# Patient Record
Sex: Male | Born: 1955 | Race: White | Hispanic: No | Marital: Married | Smoking: Former smoker
Health system: Southern US, Community
[De-identification: ages and names within clinical notes are randomized; demographics above are authoritative.]

## PROBLEM LIST (undated history)

## (undated) DIAGNOSIS — I10 Essential (primary) hypertension: Secondary | ICD-10-CM

## (undated) DIAGNOSIS — J45909 Unspecified asthma, uncomplicated: Secondary | ICD-10-CM

## (undated) DIAGNOSIS — E7800 Pure hypercholesterolemia, unspecified: Secondary | ICD-10-CM

## (undated) DIAGNOSIS — K219 Gastro-esophageal reflux disease without esophagitis: Secondary | ICD-10-CM

## (undated) DIAGNOSIS — G4730 Sleep apnea, unspecified: Secondary | ICD-10-CM

## (undated) DIAGNOSIS — R413 Other amnesia: Principal | ICD-10-CM

## (undated) HISTORY — PX: WISDOM TOOTH EXTRACTION: SHX21

## (undated) HISTORY — DX: Essential (primary) hypertension: I10

## (undated) HISTORY — DX: Other amnesia: R41.3

## (undated) HISTORY — PX: CARPAL TUNNEL RELEASE: SHX101

## (undated) HISTORY — DX: Unspecified asthma, uncomplicated: J45.909

## (undated) HISTORY — DX: Pure hypercholesterolemia, unspecified: E78.00

## (undated) HISTORY — PX: TONSILLECTOMY: SUR1361

---

## 2000-02-12 ENCOUNTER — Encounter: Payer: Self-pay | Admitting: Pulmonary Disease

## 2000-02-15 ENCOUNTER — Encounter: Payer: Self-pay | Admitting: Pulmonary Disease

## 2008-05-28 ENCOUNTER — Ambulatory Visit: Payer: Self-pay | Admitting: Unknown Physician Specialty

## 2008-06-01 ENCOUNTER — Ambulatory Visit: Payer: Self-pay | Admitting: Pulmonary Disease

## 2008-06-01 DIAGNOSIS — J309 Allergic rhinitis, unspecified: Secondary | ICD-10-CM | POA: Insufficient documentation

## 2008-06-01 DIAGNOSIS — E785 Hyperlipidemia, unspecified: Secondary | ICD-10-CM | POA: Insufficient documentation

## 2008-06-01 DIAGNOSIS — G4733 Obstructive sleep apnea (adult) (pediatric): Secondary | ICD-10-CM | POA: Insufficient documentation

## 2008-06-01 DIAGNOSIS — I10 Essential (primary) hypertension: Secondary | ICD-10-CM | POA: Insufficient documentation

## 2008-06-01 DIAGNOSIS — J45909 Unspecified asthma, uncomplicated: Secondary | ICD-10-CM | POA: Insufficient documentation

## 2008-06-22 ENCOUNTER — Encounter: Payer: Self-pay | Admitting: Pulmonary Disease

## 2008-06-29 ENCOUNTER — Encounter: Payer: Self-pay | Admitting: Pulmonary Disease

## 2008-07-27 ENCOUNTER — Ambulatory Visit: Payer: Self-pay | Admitting: Unknown Physician Specialty

## 2009-05-26 ENCOUNTER — Encounter (INDEPENDENT_AMBULATORY_CARE_PROVIDER_SITE_OTHER): Payer: Self-pay | Admitting: Internal Medicine

## 2009-05-26 ENCOUNTER — Ambulatory Visit: Payer: Self-pay | Admitting: Cardiology

## 2009-05-26 ENCOUNTER — Ambulatory Visit (HOSPITAL_COMMUNITY): Admission: RE | Admit: 2009-05-26 | Discharge: 2009-05-26 | Payer: Self-pay | Admitting: Internal Medicine

## 2009-06-01 ENCOUNTER — Ambulatory Visit: Payer: Self-pay | Admitting: Pulmonary Disease

## 2009-07-14 ENCOUNTER — Encounter: Payer: Self-pay | Admitting: Pulmonary Disease

## 2010-04-19 NOTE — Letter (Signed)
Summary: CMN  CMN   Imported By: Lehman Prom 07/14/2009 16:32:12  _____________________________________________________________________  External Attachment:    Type:   Image     Comment:   External Document

## 2010-04-19 NOTE — Assessment & Plan Note (Signed)
Summary: rov for osa   Visit Type:  Follow-up Copy to:  Carylon Perches Primary Provider/Referring Provider:  Carylon Perches  CC:  Pt here for yearly follow up. Pt states is using CPAP machine everynight approx 7 to 8 hours. Pt states is having no problems with machine, is waking up feeling rested, and denies headaches..  History of Present Illness: The pt comes in today for f/u of his osa.  He is wearing cpap compliantly, and reports no issues with mask fit or pressure. He feels that he sleeps well, and is rested during the day.  His weight is up 7 pounds from last visit a year ago.  Current Medications (verified): 1)  Advair Diskus 100-50 Mcg/dose Misc (Fluticasone-Salmeterol) .... Inhale 1 Puff Two Times A Day 2)  Atrovent Hfa 17 Mcg/act Aers (Ipratropium Bromide Hfa) .... Inhale 2 Puffs Two Times A Day 3)  Singulair 10 Mg Tabs (Montelukast Sodium) .... Take 1 Tablet By Mouth Once A Day 4)  Flonase 50 Mcg/act Susp (Fluticasone Propionate) .... 2 Sprays in Each Nostril Once Daily 5)  Zocor 20 Mg Tabs (Simvastatin) .... Take 1 Tablet By Mouth Once A Day 6)  Cozaar 100 Mg Tabs (Losartan Potassium) .... Take 1 Tablet By Mouth Once A Day 7)  Astelin 137 Mcg/spray Soln (Azelastine Hcl) .... As Needed 8)  Proair Hfa 108 (90 Base) Mcg/act  Aers (Albuterol Sulfate) .Marland Kitchen.. 1-2 Puffs Every 4-6 Hours As Needed 9)  Prilosec 20 Mg Cpdr (Omeprazole) .... Take 1 Tablet By Mouth Once A Day  Allergies (verified): 1)  ! Zestril  Review of Systems      See HPI  Vital Signs:  Patient profile:   55 year old male Height:      68 inches Weight:      245 pounds BMI:     37.39 O2 Sat:      96 % on Room air Temp:     98.2 degrees F oral Pulse rate:   74 / minute BP sitting:   112 / 66  (left arm) Cuff size:   large  Vitals Entered By: Zackery Barefoot CMA (June 01, 2009 10:04 AM)  O2 Flow:  Room air CC: Pt here for yearly follow up. Pt states is using CPAP machine everynight approx 7 to 8 hours. Pt states is  having no problems with machine, is waking up feeling rested, denies headaches. Comments Medications reviewed with patient Verified contact number and pharmacy with patient Zackery Barefoot CMA  June 01, 2009 10:04 AM    Physical Exam  General:  ow male in nad Nose:  no skin breakdown or pressure necrosis from cpap mask   Impression & Recommendations:  Problem # 1:  OBSTRUCTIVE SLEEP APNEA (ICD-327.23) the pt is doing well with cpap, and denies any sleepiness issues.  I have asked him to continue on treatment, and to work on weight loss.  I will see him back in one year.  Medications Added to Medication List This Visit: 1)  Cozaar 100 Mg Tabs (Losartan potassium) .... Take 1 tablet by mouth once a day 2)  Prilosec 20 Mg Cpdr (Omeprazole) .... Take 1 tablet by mouth once a day  Other Orders: Est. Patient Level II (27253) DME Referral (DME)  Patient Instructions: 1)  continue to work on weight loss 2)  followup with me in 12mos   Immunization History:  Influenza Immunization History:    Influenza:  historical (12/21/2008)

## 2012-03-26 ENCOUNTER — Ambulatory Visit (INDEPENDENT_AMBULATORY_CARE_PROVIDER_SITE_OTHER): Payer: BC Managed Care – PPO | Admitting: Urology

## 2012-03-26 ENCOUNTER — Other Ambulatory Visit: Payer: Self-pay | Admitting: Urology

## 2012-03-26 DIAGNOSIS — R972 Elevated prostate specific antigen [PSA]: Secondary | ICD-10-CM

## 2012-03-26 DIAGNOSIS — E291 Testicular hypofunction: Secondary | ICD-10-CM

## 2012-03-26 DIAGNOSIS — N401 Enlarged prostate with lower urinary tract symptoms: Secondary | ICD-10-CM

## 2012-04-30 ENCOUNTER — Ambulatory Visit (HOSPITAL_COMMUNITY)
Admission: RE | Admit: 2012-04-30 | Discharge: 2012-04-30 | Disposition: A | Discharge status: Home / Self Care | Payer: BC Managed Care – PPO | Source: Ambulatory Visit | Attending: Urology | Admitting: Urology

## 2012-04-30 ENCOUNTER — Other Ambulatory Visit: Payer: Self-pay | Admitting: Urology

## 2012-04-30 DIAGNOSIS — E291 Testicular hypofunction: Secondary | ICD-10-CM

## 2012-04-30 MED ORDER — LIDOCAINE HCL (PF) 2 % IJ SOLN
INTRAMUSCULAR | Status: AC
  Filled 2012-04-30: qty 10

## 2012-04-30 NOTE — Progress Notes (Signed)
Lidocaine 2%       10mL injected                       Transrectal prostate biopsies performed 

## 2012-08-19 ENCOUNTER — Ambulatory Visit: Payer: Self-pay | Admitting: Ophthalmology

## 2014-07-10 NOTE — Op Note (Signed)
PATIENT NAME:  Eric Lam, Eric Lam MR#:  [id number] DATE OF BIRTH:  1955-12-02  DATE OF PROCEDURE:  08/19/2012  PREOPERATIVE DIAGNOSIS: Cataract, right eye.   POSTOPERATIVE DIAGNOSIS: Cataract, right eye.   PROCEDURE PERFORMED: Extracapsular cataract extraction using phacoemulsification and placement of Alcon [id number], 19.0 diopter posterior chamber lens, serial number [id number].   SURGEON: Loura Back. Kailee Essman, M.D.   ANESTHESIA: 4% lidocaine and 0.75% Marcaine a 50/50 mixture with 10 units/mL of Hylenex  added, given as a peribulbar.   ANESTHESIOLOGIST: Estanislado Emms, MD  COMPLICATIONS: None.   ESTIMATED BLOOD LOSS: Less than 1 mL.   DESCRIPTION OF PROCEDURE:  The patient was brought to the operating room and given a peribulbar block.  The patient was then prepped and draped in the usual fashion.  The vertical rectus muscles were imbricated using 5-0 silk sutures.  These sutures were then clamped to the sterile drapes as bridle sutures.  A limbal peritomy was performed extending two clock hours and hemostasis was obtained with cautery.  A partial thickness scleral groove was made at the surgical limbus and dissected anteriorly in a lamellar dissection using an Alcon crescent knife.  The anterior chamber was entered superonasally with a Superblade and through the lamellar dissection with a 2.6 mm keratome.  DisCoVisc was used to replace the aqueous and a continuous tear capsulorrhexis was carried out.  Hydrodissection and hydrodelineation were carried out with balanced salt and a 27 gauge canula.  The nucleus was rotated to confirm the effectiveness of the hydrodissection.  Phacoemulsification was carried out using a divide-and-conquer technique.  Total ultrasound time was 54 seconds with an average power of 17.2 percent. CD of 17.85%.    Irrigation/aspiration was used to remove the residual cortex.  DisCoVisc was used to inflate the capsule and the internal incision was enlarged to 3  mm with the crescent knife.  The intraocular lens was folded and inserted into the capsular bag using the Acrysert delivery system.  Irrigation/aspiration was used to remove the residual DisCoVisc.  Miostat was injected into the anterior chamber through the paracentesis track to inflate the anterior chamber and induce miosis.  One-tenth of a milliliter of cefuroxime was injected through the paracentesis. The wound was checked for leaks and none were found. The conjunctiva was closed with cautery and the bridle sutures were removed.  Two drops of 0.3% Vigamox were placed on the eye.   An eye shield was placed on the eye.  The patient was discharged to the recovery room in good condition.   ____________________________ Loura Back Zaven Klemens, MD sad:cc D: 08/19/2012 13:55:00 ET T: 08/19/2012 16:27:22 ET JOB#: [id number]  cc: Remo Lipps A. Caitlynne Harbeck, MD, <Dictator> Martie Lee MD ELECTRONICALLY SIGNED 09/02/2012 13:32

## 2016-10-03 ENCOUNTER — Ambulatory Visit (INDEPENDENT_AMBULATORY_CARE_PROVIDER_SITE_OTHER): Payer: BC Managed Care – PPO | Admitting: Neurology

## 2016-10-03 ENCOUNTER — Encounter: Payer: Self-pay | Admitting: Neurology

## 2016-10-03 DIAGNOSIS — R413 Other amnesia: Secondary | ICD-10-CM | POA: Insufficient documentation

## 2016-10-03 HISTORY — DX: Other amnesia: R41.3

## 2016-10-03 NOTE — Patient Instructions (Signed)
   We will get blood work and MRI of the brain.

## 2016-10-03 NOTE — Progress Notes (Signed)
Reason for visit: Memory disturbance  Referring physician: Dr. Domingo Madeira Eric Lam. is a 60 y.o. male  History of present illness:  Eric Lam is a 61 year old right-handed white male with a history of alcohol overuse. He comes in today with his wife who has indicated that she has noted a change in his memory over the last year. The patient may have anywhere from 4 to up to 8 alcohol beverages a day, the patient has reduced his alcohol intake to drinking only 1 pint of alcohol daily. The patient also has sleep apnea on CPAP. He believes that this has improved his energy level, he is not as sleepy during the day. The memory issues are mainly manifested by the fact that the patient repeats himself frequently and he does not recall recent conversations. The patient is very organized, he takes notes and keeps up with medications and appointments quite well. The patient does the finances without difficulty. He is able to operate a motor vehicle safely and he has not had any problems with directions. The problem with cognitive functioning noted by the wife seems to be worse in the evening hours, better in the morning. Initially she attributed these changes to alcohol use, but he more recently is working for the [employer], and when he gets home at 9 PM he still has some problems with cognitive functioning even after returning from work. The patient denies any numbness or weakness of the extremities, he denies any significant balance issues or difficulty controlling the bowels or the bladder. He has a prominent family history of memory problems, both parents are in their mid 66s and have memory problems, a paternal grandmother also died with dementia. He is sent to this office for further evaluation. He is on cholesterol-lowering agents, he has been on this for many years, he did not note any cognitive issues when he first went on the medication.  Past Medical History:  Diagnosis Date  . Asthma     . High blood pressure   . High cholesterol     Past Surgical History:  Procedure Laterality Date  . CARPAL TUNNEL RELEASE Bilateral   . TONSILLECTOMY     Age 32  . WISDOM TOOTH EXTRACTION     x 4    Family History  Problem Relation Age of Onset  . Dementia Mother   . Thyroid disease Mother   . Diabetes Mellitus II Mother   . Cervical cancer Mother   . Glaucoma Mother   . Other Father        low blood pressure  . Memory loss Father     Social history:  reports that he quit smoking about 24 years ago. He has never used smokeless tobacco. He reports that he drinks alcohol. He reports that he does not use drugs.  Medications:  Prior to Admission medications   Medication Sig Start Date End Date Taking? Authorizing Provider  ADVAIR DISKUS 100-50 MCG/DOSE AEPB Inhale 1 puff into the lungs.  09/15/16  Yes [provider]  ASPIRIN 81 PO Take 1 tablet by mouth daily.   Yes [provider]  azelastine (ASTELIN) 0.1 % nasal spray Place 1 spray into both nostrils daily. Use in each nostril as directed   Yes [provider]  fluticasone (FLONASE) 50 MCG/ACT nasal spray  09/05/16  Yes [provider]  losartan (COZAAR) 100 MG tablet  08/15/16  Yes [provider]  montelukast (SINGULAIR) 10 MG tablet  09/18/16  Yes [provider]  Multiple Vitamin (MULTIVITAMIN) tablet Take 1 tablet by mouth daily.   Yes [provider]  omeprazole (PRILOSEC) 20 MG capsule  09/11/16  Yes [provider]  [facility] injection  08/18/16  Yes [provider]  simvastatin (ZOCOR) 20 MG tablet  08/15/16  Yes [provider]  SPIRIVA HANDIHALER 18 MCG inhalation capsule  09/15/16  Yes [provider]      Allergies  Allergen Reactions  . Lisinopril     ROS:  Out of a complete 14 system review of symptoms, the patient complains only of the following symptoms, and all other reviewed systems are negative.  Memory  disturbance  Blood pressure 135/76, pulse (!) 56, height 5\' 8"  (1.727 m), weight 232 lb (105.2 kg).  Physical Exam  General: The patient is alert and cooperative at the time of the examination. The patient is moderately to markedly obese.  Eyes: Pupils are equal, round, and reactive to light. Discs are flat bilaterally.  Neck: The neck is supple, no carotid bruits are noted.  Respiratory: The respiratory examination is clear.  Cardiovascular: The cardiovascular examination reveals a regular rate and rhythm, no obvious murmurs or rubs are noted.  Skin: Extremities are without significant edema.  Neurologic Exam  Mental status: The patient is alert and oriented x 3 at the time of the examination. The patient has apparent normal recent and remote memory, with an apparently normal attention span and concentration ability. Mini-Mental Status Examination done today shows a total score of 30/30.  Cranial nerves: Facial symmetry is present. There is good sensation of the face to pinprick and soft touch bilaterally. The strength of the facial muscles and the muscles to head turning and shoulder shrug are normal bilaterally. Speech is well enunciated, no aphasia or dysarthria is noted. Extraocular movements are full. Visual fields are full. The tongue is midline, and the patient has symmetric elevation of the soft palate. No obvious hearing deficits are noted.  Motor: The motor testing reveals 5 over 5 strength of all 4 extremities. Good symmetric motor tone is noted throughout.  Sensory: Sensory testing is intact to pinprick, soft touch, vibration sensation, and position sense on all 4 extremities, with exception of a stocking pattern pinprick sensory deficit in the distal third of the lower extremities bilaterally. No evidence of extinction is noted.  Coordination: Cerebellar testing reveals good finger-nose-finger and heel-to-shin bilaterally.  Gait and station: Gait is normal. Tandem gait is  normal. Romberg is negative. No drift is seen.  Reflexes: Deep tendon reflexes are symmetric and normal bilaterally, with exception of some decrease in ankle jerk reflexes bilaterally. Toes are downgoing bilaterally.   Assessment/Plan:  1. Mild memory disturbance  2. Alcohol overuse  3. Sleep apnea on CPAP  The patient will be sent for further blood work today, he will have MRI evaluation of the brain. The use of alcohol may be a significant factor in development of some mild memory issues. The patient is on CPAP, this seems to help his overall energy level during the day. The wife has noted a fatigue aspect to the cognitive functioning, he does worse in the evening hours. The patient will follow-up in 6 months, if they desire to go on medications for memory this can be started.  Jill Alexanders MD 10/03/2016 8:40 AM  [facility] [address] [place], [place] [address]  Phone [phone] Fax [phone]

## 2016-10-04 ENCOUNTER — Telehealth: Payer: Self-pay | Admitting: *Deleted

## 2016-10-04 LAB — SEDIMENTATION RATE: Sed Rate: 2 mm/hr (ref 0–30)

## 2016-10-04 LAB — RPR: RPR Ser Ql: NONREACTIVE

## 2016-10-04 LAB — VITAMIN B12: Vitamin B-12: 525 pg/mL (ref 232–1245)

## 2016-10-04 LAB — HIV ANTIBODY (ROUTINE TESTING W REFLEX): HIV SCREEN 4TH GENERATION: NONREACTIVE

## 2016-10-04 NOTE — Telephone Encounter (Signed)
Called and LVM for pt advising labs unremarkable per CW,MD. Gave [facility] phone number if he has any further questions or concerns.

## 2016-10-04 NOTE — Telephone Encounter (Signed)
-----   Message from Kathrynn Ducking, MD sent at 10/04/2016  1:03 PM EDT -----   The blood work results are unremarkable. Please call the patient.  ----- Message ----- From: Lavone Neri Lab Results In Sent: 10/04/2016  11:56 AM To: Kathrynn Ducking, MD

## 2016-10-11 ENCOUNTER — Ambulatory Visit (INDEPENDENT_AMBULATORY_CARE_PROVIDER_SITE_OTHER): Payer: BC Managed Care – PPO

## 2016-10-11 DIAGNOSIS — R413 Other amnesia: Secondary | ICD-10-CM | POA: Diagnosis not present

## 2016-10-13 ENCOUNTER — Telehealth: Payer: Self-pay | Admitting: Neurology

## 2016-10-13 NOTE — Telephone Encounter (Signed)
I called the patient. The MRI of the brain is normal, no evidence of significant atrophy that would suggest Alzheimer's disease.  We will follow the memory over time.  MRI brain 10/12/16:  IMPRESSION:  Normal MRI brain (without).

## 2017-04-11 ENCOUNTER — Ambulatory Visit: Payer: BC Managed Care – PPO | Admitting: Neurology

## 2018-11-26 ENCOUNTER — Other Ambulatory Visit (HOSPITAL_COMMUNITY)
Admission: RE | Admit: 2018-11-26 | Discharge: 2018-11-26 | Disposition: A | Discharge status: Home / Self Care | Payer: BC Managed Care – PPO | Source: Ambulatory Visit | Attending: Urology | Admitting: Urology

## 2018-11-26 ENCOUNTER — Ambulatory Visit (INDEPENDENT_AMBULATORY_CARE_PROVIDER_SITE_OTHER): Payer: BC Managed Care – PPO | Admitting: Urology

## 2018-11-26 DIAGNOSIS — N401 Enlarged prostate with lower urinary tract symptoms: Secondary | ICD-10-CM

## 2018-11-26 DIAGNOSIS — R351 Nocturia: Secondary | ICD-10-CM | POA: Diagnosis not present

## 2018-11-26 DIAGNOSIS — R972 Elevated prostate specific antigen [PSA]: Secondary | ICD-10-CM

## 2018-11-29 LAB — MISC LABCORP TEST (SEND OUT)

## 2018-12-13 ENCOUNTER — Other Ambulatory Visit: Payer: Self-pay | Admitting: Urology

## 2018-12-13 DIAGNOSIS — R972 Elevated prostate specific antigen [PSA]: Secondary | ICD-10-CM

## 2018-12-24 ENCOUNTER — Other Ambulatory Visit: Payer: Self-pay

## 2018-12-24 ENCOUNTER — Ambulatory Visit (HOSPITAL_COMMUNITY)
Admission: RE | Admit: 2018-12-24 | Discharge: 2018-12-24 | Disposition: A | Discharge status: Home / Self Care | Payer: BC Managed Care – PPO | Source: Ambulatory Visit | Attending: Urology | Admitting: Urology

## 2018-12-24 DIAGNOSIS — R972 Elevated prostate specific antigen [PSA]: Secondary | ICD-10-CM | POA: Diagnosis not present

## 2018-12-24 LAB — POCT I-STAT CREATININE: Creatinine, Ser: 0.8 mg/dL (ref 0.61–1.24)

## 2018-12-24 MED ORDER — GADOBUTROL 1 MMOL/ML IV SOLN
10.0000 mL | Freq: Once | INTRAVENOUS | Status: AC | PRN
  Administered 2018-12-24: 09:00:00 10 mL via INTRAVENOUS

## 2018-12-31 ENCOUNTER — Other Ambulatory Visit: Payer: Self-pay

## 2018-12-31 ENCOUNTER — Encounter: Payer: Self-pay | Admitting: *Deleted

## 2019-01-03 ENCOUNTER — Other Ambulatory Visit
Admission: RE | Admit: 2019-01-03 | Discharge: 2019-01-03 | Disposition: A | Discharge status: Home / Self Care | Payer: BC Managed Care – PPO | Source: Ambulatory Visit | Attending: Ophthalmology | Admitting: Ophthalmology

## 2019-01-03 ENCOUNTER — Other Ambulatory Visit: Payer: Self-pay

## 2019-01-03 DIAGNOSIS — Z20828 Contact with and (suspected) exposure to other viral communicable diseases: Secondary | ICD-10-CM | POA: Diagnosis present

## 2019-01-04 LAB — SARS CORONAVIRUS 2 (TAT 6-24 HRS): SARS Coronavirus 2: NEGATIVE

## 2019-01-07 ENCOUNTER — Other Ambulatory Visit: Payer: Self-pay

## 2019-01-07 ENCOUNTER — Ambulatory Visit: Payer: BC Managed Care – PPO | Admitting: Anesthesiology

## 2019-01-07 ENCOUNTER — Ambulatory Visit
Admission: RE | Admit: 2019-01-07 | Discharge: 2019-01-07 | Disposition: A | Discharge status: Home / Self Care | Payer: BC Managed Care – PPO | Attending: Ophthalmology | Admitting: Ophthalmology

## 2019-01-07 ENCOUNTER — Encounter
Admission: RE | Disposition: A | Discharge status: Home / Self Care | Payer: Self-pay | Source: Home / Self Care | Attending: Ophthalmology

## 2019-01-07 DIAGNOSIS — E7800 Pure hypercholesterolemia, unspecified: Secondary | ICD-10-CM | POA: Insufficient documentation

## 2019-01-07 DIAGNOSIS — G4730 Sleep apnea, unspecified: Secondary | ICD-10-CM | POA: Insufficient documentation

## 2019-01-07 DIAGNOSIS — H2512 Age-related nuclear cataract, left eye: Secondary | ICD-10-CM | POA: Diagnosis present

## 2019-01-07 DIAGNOSIS — K219 Gastro-esophageal reflux disease without esophagitis: Secondary | ICD-10-CM | POA: Insufficient documentation

## 2019-01-07 DIAGNOSIS — J45909 Unspecified asthma, uncomplicated: Secondary | ICD-10-CM | POA: Insufficient documentation

## 2019-01-07 DIAGNOSIS — Z9841 Cataract extraction status, right eye: Secondary | ICD-10-CM | POA: Diagnosis not present

## 2019-01-07 DIAGNOSIS — Z87891 Personal history of nicotine dependence: Secondary | ICD-10-CM | POA: Insufficient documentation

## 2019-01-07 DIAGNOSIS — N400 Enlarged prostate without lower urinary tract symptoms: Secondary | ICD-10-CM | POA: Diagnosis not present

## 2019-01-07 DIAGNOSIS — I10 Essential (primary) hypertension: Secondary | ICD-10-CM | POA: Diagnosis not present

## 2019-01-07 HISTORY — DX: Gastro-esophageal reflux disease without esophagitis: K21.9

## 2019-01-07 HISTORY — PX: CATARACT EXTRACTION W/PHACO: SHX586

## 2019-01-07 HISTORY — DX: Sleep apnea, unspecified: G47.30

## 2019-01-07 SURGERY — PHACOEMULSIFICATION, CATARACT, WITH IOL INSERTION
Anesthesia: Monitor Anesthesia Care | Site: Eye | Laterality: Left

## 2019-01-07 MED ORDER — FENTANYL CITRATE (PF) 100 MCG/2ML IJ SOLN
INTRAMUSCULAR | Status: DC | PRN
  Administered 2019-01-07 (×2): 50 ug via INTRAVENOUS

## 2019-01-07 MED ORDER — TETRACAINE HCL 0.5 % OP SOLN
1.0000 [drp] | OPHTHALMIC | Status: DC | PRN
  Administered 2019-01-07 (×3): 1 [drp] via OPHTHALMIC

## 2019-01-07 MED ORDER — LIDOCAINE HCL (CARDIAC) PF 100 MG/5ML IV SOSY
PREFILLED_SYRINGE | INTRAVENOUS | Status: DC | PRN
  Administered 2019-01-07: 50 mg via INTRAVENOUS

## 2019-01-07 MED ORDER — ACETAMINOPHEN 160 MG/5ML PO SOLN: 325.0000 mg | Freq: Once | ORAL | Status: DC

## 2019-01-07 MED ORDER — LIDOCAINE HCL (PF) 2 % IJ SOLN
INTRAOCULAR | Status: DC | PRN
  Administered 2019-01-07: 1 mL

## 2019-01-07 MED ORDER — ARMC OPHTHALMIC DILATING DROPS
1.0000 "application " | OPHTHALMIC | Status: DC | PRN
  Administered 2019-01-07 (×3): 1 via OPHTHALMIC

## 2019-01-07 MED ORDER — BRIMONIDINE TARTRATE-TIMOLOL 0.2-0.5 % OP SOLN
OPHTHALMIC | Status: DC | PRN
  Administered 2019-01-07: 1 [drp] via OPHTHALMIC

## 2019-01-07 MED ORDER — GLYCOPYRROLATE 0.2 MG/ML IJ SOLN
INTRAMUSCULAR | Status: DC | PRN
  Administered 2019-01-07: 0.1 mg via INTRAVENOUS

## 2019-01-07 MED ORDER — NA CHONDROIT SULF-NA HYALURON 40-17 MG/ML IO SOLN
INTRAOCULAR | Status: DC | PRN
  Administered 2019-01-07: 1 mL via INTRAOCULAR

## 2019-01-07 MED ORDER — MIDAZOLAM HCL 2 MG/2ML IJ SOLN
INTRAMUSCULAR | Status: DC | PRN
  Administered 2019-01-07: 2 mg via INTRAVENOUS

## 2019-01-07 MED ORDER — MOXIFLOXACIN HCL 0.5 % OP SOLN
OPHTHALMIC | Status: DC | PRN
  Administered 2019-01-07: 0.2 mL via OPHTHALMIC

## 2019-01-07 MED ORDER — LACTATED RINGERS IV SOLN: INTRAVENOUS | Status: DC

## 2019-01-07 MED ORDER — EPINEPHRINE PF 1 MG/ML IJ SOLN
INTRAOCULAR | Status: DC | PRN
  Administered 2019-01-07: 12:00:00 48 mL via OPHTHALMIC

## 2019-01-07 MED ORDER — ACETAMINOPHEN 325 MG PO TABS: 325.0000 mg | ORAL_TABLET | Freq: Once | ORAL | Status: DC

## 2019-01-07 SURGICAL SUPPLY — 21 items
CANNULA ANT/CHMB 27G (MISCELLANEOUS) ×2 IMPLANT
CANNULA ANT/CHMB 27GA (MISCELLANEOUS) ×6 IMPLANT
GLOVE SURG LX 8.0 MICRO (GLOVE) ×2
GLOVE SURG LX STRL 8.0 MICRO (GLOVE) ×1 IMPLANT
GLOVE SURG TRIUMPH 8.0 PF LTX (GLOVE) ×3 IMPLANT
GOWN STRL REUS W/ TWL LRG LVL3 (GOWN DISPOSABLE) ×2 IMPLANT
GOWN STRL REUS W/TWL LRG LVL3 (GOWN DISPOSABLE) ×4
LENS IOL ACRSF IQ ULTRA 18.0 (Intraocular Lens) IMPLANT
LENS IOL ACRYSOF IQ 18.0 (Intraocular Lens) ×3 IMPLANT
MARKER SKIN DUAL TIP RULER LAB (MISCELLANEOUS) ×3 IMPLANT
NDL FILTER BLUNT 18X1 1/2 (NEEDLE) ×1 IMPLANT
NDL RETROBULBAR .5 NSTRL (NEEDLE) ×3 IMPLANT
NEEDLE FILTER BLUNT 18X 1/2SAF (NEEDLE) ×2
NEEDLE FILTER BLUNT 18X1 1/2 (NEEDLE) ×1 IMPLANT
PACK EYE AFTER SURG (MISCELLANEOUS) ×3 IMPLANT
PACK OPTHALMIC (MISCELLANEOUS) ×3 IMPLANT
PACK PORFILIO (MISCELLANEOUS) ×3 IMPLANT
SYR 3ML LL SCALE MARK (SYRINGE) ×3 IMPLANT
SYR TB 1ML LUER SLIP (SYRINGE) ×3 IMPLANT
WATER STERILE IRR 250ML POUR (IV SOLUTION) ×3 IMPLANT
WIPE NON LINTING 3.25X3.25 (MISCELLANEOUS) ×3 IMPLANT

## 2019-01-07 NOTE — Anesthesia Procedure Notes (Signed)
Procedure Name: MAC Performed by: Tessy Pawelski M, CRNA Pre-anesthesia Checklist: Patient identified, Emergency Drugs available, Suction available, Patient being monitored and Timeout performed Oxygen Delivery Method: Nasal cannula       

## 2019-01-07 NOTE — Anesthesia Procedure Notes (Signed)
Procedure Name: MAC Performed by: Izetta Dakin, CRNA Pre-anesthesia Checklist: Timeout performed, Patient being monitored, Suction available, Patient identified and Emergency Drugs available Patient Re-evaluated:Patient Re-evaluated prior to induction Oxygen Delivery Method: Nasal cannula

## 2019-01-07 NOTE — Op Note (Signed)
PREOPERATIVE DIAGNOSIS:  Nuclear sclerotic cataract of the left eye.   POSTOPERATIVE DIAGNOSIS:  Nuclear sclerotic cataract of the left eye.   OPERATIVE PROCEDURE:@   SURGEON:  Birder Robson, MD.   ANESTHESIA:  Anesthesiologist: Ronelle Nigh, MD CRNA: Izetta Dakin, CRNA  1.      Managed anesthesia care. 2.     0.66ml of Shugarcaine was instilled following the paracentesis   COMPLICATIONS:  None.   TECHNIQUE:   Stop and chop   DESCRIPTION OF PROCEDURE:  The patient was examined and consented in the preoperative holding area where the aforementioned topical anesthesia was applied to the left eye and then brought back to the Operating Room where the left eye was prepped and draped in the usual sterile ophthalmic fashion and a lid speculum was placed. A paracentesis was created with the side port blade and the anterior chamber was filled with viscoelastic. A near clear corneal incision was performed with the steel keratome. A continuous curvilinear capsulorrhexis was performed with a cystotome followed by the capsulorrhexis forceps. Hydrodissection and hydrodelineation were carried out with BSS on a blunt cannula. The lens was removed in a stop and chop  technique and the remaining cortical material was removed with the irrigation-aspiration handpiece. The capsular bag was inflated with viscoelastic and the Technis [id number] lens was placed in the capsular bag without complication. The remaining viscoelastic was removed from the eye with the irrigation-aspiration handpiece. The wounds were hydrated. The anterior chamber was flushed with BSS and the eye was inflated to physiologic pressure. 0.28ml Vigamox was placed in the anterior chamber. The wounds were found to be water tight. The eye was dressed with Combigan. The patient was given protective glasses to wear throughout the day and a shield with which to sleep tonight. The patient was also given drops with which to begin a drop regimen today  and will follow-up with me in one day. Implant Name Type Inv. Item Serial No. Manufacturer Lot No. LRB No. Used Action  LENS IOL ACRYSOF IQ 18.0 - [id number] Intraocular Lens LENS IOL ACRYSOF IQ 18.0 [id number] ALCON  Left 1 Implanted    Procedure(s) with comments: CATARACT EXTRACTION PHACO AND INTRAOCULAR LENS PLACEMENT (IOC) LEFT  00:35.0  16.3%  5.70 (Left) - sleep apnea  Electronically signed: Birder Robson 01/07/2019 11:33 AM

## 2019-01-07 NOTE — Anesthesia Preprocedure Evaluation (Signed)
Anesthesia Evaluation  Patient identified by MRN, date of birth, ID band Patient awake    Reviewed: Allergy & Precautions, H&P , NPO status , Patient's Chart, lab work & pertinent test results  Airway Mallampati: III  TM Distance: >3 FB Neck ROM: full    Dental no notable dental hx.    Pulmonary asthma , sleep apnea and Continuous Positive Airway Pressure Ventilation , former smoker,    Pulmonary exam normal breath sounds clear to auscultation       Cardiovascular hypertension, Normal cardiovascular exam Rhythm:regular Rate:Normal     Neuro/Psych    GI/Hepatic GERD  ,  Endo/Other    Renal/GU      Musculoskeletal   Abdominal   Peds  Hematology   Anesthesia Other Findings   Reproductive/Obstetrics                             Anesthesia Physical Anesthesia Plan  ASA: III  Anesthesia Plan: MAC   Post-op Pain Management:    Induction:   PONV Risk Score and Plan: 1 and Midazolam, TIVA and Treatment may vary due to age or medical condition  Airway Management Planned:   Additional Equipment:   Intra-op Plan:   Post-operative Plan:   Informed Consent: I have reviewed the patients History and Physical, chart, labs and discussed the procedure including the risks, benefits and alternatives for the proposed anesthesia with the patient or authorized representative who has indicated his/her understanding and acceptance.       Plan Discussed with: CRNA  Anesthesia Plan Comments:         Anesthesia Quick Evaluation

## 2019-01-07 NOTE — Discharge Instructions (Signed)

## 2019-01-07 NOTE — Transfer of Care (Addendum)
Immediate Anesthesia Transfer of Care Note  Patient: Eric Lam.  Procedure(s) Performed: CATARACT EXTRACTION PHACO AND INTRAOCULAR LENS PLACEMENT (IOC) LEFT  00:35.0  16.3%  5.70 (Left Eye)  Patient Location: PACU  Anesthesia Type: MAC  Level of Consciousness: awake, alert  and patient cooperative  Airway and Oxygen Therapy: Patient Spontanous Breathing and Patient connected to supplemental oxygen  Post-op Assessment: Post-op Vital signs reviewed, Patient's Cardiovascular Status Stable, Respiratory Function Stable, Patent Airway and No signs of Nausea or vomiting  Post-op Vital Signs: Reviewed and stable  Complications: No apparent anesthesia complications

## 2019-01-07 NOTE — Anesthesia Postprocedure Evaluation (Signed)
Anesthesia Post Note  Patient: Eric Lam.  Procedure(s) Performed: CATARACT EXTRACTION PHACO AND INTRAOCULAR LENS PLACEMENT (IOC) LEFT  00:35.0  16.3%  5.70 (Left Eye)  Patient location during evaluation: PACU Anesthesia Type: MAC Level of consciousness: awake and alert and oriented Pain management: satisfactory to patient Vital Signs Assessment: post-procedure vital signs reviewed and stable Respiratory status: spontaneous breathing, nonlabored ventilation and respiratory function stable Cardiovascular status: blood pressure returned to baseline and stable Postop Assessment: Adequate PO intake and No signs of nausea or vomiting Anesthetic complications: no    Raliegh Ip

## 2019-01-07 NOTE — H&P (Signed)
All labs reviewed. Abnormal studies sent to patients PCP when indicated.  Previous H&P reviewed, patient examined, there are NO CHANGES.  Eric Parrillo Porfilio10/20/202011:05 AM

## 2019-01-08 ENCOUNTER — Encounter: Payer: Self-pay | Admitting: Ophthalmology

## 2019-01-24 ENCOUNTER — Encounter: Payer: Self-pay | Admitting: Nurse Practitioner

## 2019-02-20 ENCOUNTER — Ambulatory Visit: Payer: BC Managed Care – PPO | Admitting: Nurse Practitioner

## 2019-02-27 ENCOUNTER — Other Ambulatory Visit: Payer: Self-pay

## 2019-02-27 DIAGNOSIS — Z20822 Contact with and (suspected) exposure to covid-19: Secondary | ICD-10-CM

## 2019-02-28 LAB — NOVEL CORONAVIRUS, NAA: SARS-CoV-2, NAA: NOT DETECTED

## 2019-04-15 ENCOUNTER — Other Ambulatory Visit: Payer: Self-pay

## 2019-04-15 ENCOUNTER — Ambulatory Visit: Payer: BC Managed Care – PPO | Admitting: Nurse Practitioner

## 2019-04-15 ENCOUNTER — Encounter: Payer: Self-pay | Admitting: Nurse Practitioner

## 2019-04-15 DIAGNOSIS — Z86010 Personal history of colonic polyps: Secondary | ICD-10-CM

## 2019-04-15 MED ORDER — SUPREP BOWEL PREP KIT 17.5-3.13-1.6 GM/177ML PO SOLN: 1.0000 | ORAL | 0 refills | Status: DC

## 2019-04-15 NOTE — Patient Instructions (Signed)
Your health issues we discussed today were:   Need for colonoscopy: 1. As we discussed, our schedulers will call when we have the okay to proceed with elective procedures 2. Call us if you see any rectal bleeding or other concerning symptoms that might make your colonoscopy more urgent 3. Further recommendations will follow your colonoscopy, based on results  Overall I recommend:  1. Continue your other current medications 2. Return for follow-up based on recommendations made after colonoscopy, or as needed for GI symptoms 3. Call us if you have any questions or concerns.   ---------------------------------------------------------------  COVID-19 Vaccine Information can be found at: [email or web address].co.uk For questions related to vaccine distribution or appointments, please email [email or web address] or call 2622673706.   ---------------------------------------------------------------   At [facility] we value your feedback. You may receive a survey about your visit today. Please share your experience as we strive to create trusting relationships with our patients to provide genuine, compassionate, quality care.  We appreciate your understanding and patience as we review any laboratory studies, imaging, and other diagnostic tests that are ordered as we care for you. Our office policy is 5 business days for review of these results, and any emergent or urgent results are addressed in a timely manner for your best interest. If you do not hear from our office in 1 week, please contact us.   We also encourage the use of MyChart, which contains your medical information for your review as well. If you are not enrolled in this feature, an access code is on this after visit summary for your convenience. Thank you for allowing [place] to be involved in your care.  It was great to see you today!  I hope you have a great day!!

## 2019-04-15 NOTE — Progress Notes (Signed)
Primary Care Physician:  Asencion Noble, MD Primary Gastroenterologist:  Dr. Gala Romney  Chief Complaint  Patient presents with  . Colonoscopy    HPI:   Eric Lam. is a 64 y.o. male who presents on referral from primary care to schedule colonoscopy.  Nurse/phone triage was deferred office visit due to alcohol use likely necessitating augmented sedation.  No history of colonoscopy in our system.  Today he states he's doing ok overall. Last colonoscopy by Dr. Tiffany Kocher at [facility] who has retired. Patient states personal history of colon polyps. Last colonoscopy appears to have been 08/19/2013, but no notes available in [place]. He states he had polyps at his first colonoscopy (apprximatly 1996). Has had more polyps on repeat TCS with polyps continually found. Last colonoscopy they found 1-2 "real small" polyps (per the patient). Due in 2020. Denies abdominal pain, N/V, hematochezia, melena, fever, chills, unintentional weight loss. Denies URI or flu-like symptoms. Denies loss of sense of taste or smell. Denies chest pain, dyspnea, dizziness, lightheadedness, syncope, near syncope. Denies any other upper or lower GI symptoms.   ETOH drinks about a half pint a day. No recreational drug use.   Past Medical History:  Diagnosis Date  . Asthma   . GERD (gastroesophageal reflux disease)   . High blood pressure   . High cholesterol   . Memory disorder 10/03/2016  . Sleep apnea    CPAP    Past Surgical History:  Procedure Laterality Date  . CARPAL TUNNEL RELEASE Bilateral   . CATARACT EXTRACTION W/PHACO Left 01/07/2019   Procedure: CATARACT EXTRACTION PHACO AND INTRAOCULAR LENS PLACEMENT (IOC) LEFT  00:35.0  16.3%  5.70;  Surgeon: Birder Robson, MD;  Location: [place];  Service: Ophthalmology;  Laterality: Left;  sleep apnea  . TONSILLECTOMY     Age 89  . WISDOM TOOTH EXTRACTION     x 4    Current Outpatient Medications  Medication Sig Dispense Refill  .  ADVAIR DISKUS 100-50 MCG/DOSE AEPB Inhale 1 puff into the lungs.     Marland Kitchen azelastine (ASTELIN) 0.1 % nasal spray Place 1 spray into both nostrils daily. Use in each nostril as directed    . fluticasone (FLONASE) 50 MCG/ACT nasal spray     . losartan (COZAAR) 100 MG tablet daily.     . montelukast (SINGULAIR) 10 MG tablet daily.     Marland Kitchen omeprazole (PRILOSEC) 20 MG capsule daily.     Marland Kitchen SHINGRIX injection     . simvastatin (ZOCOR) 20 MG tablet daily.     Marland Kitchen SPIRIVA HANDIHALER 18 MCG inhalation capsule     . tamsulosin (FLOMAX) 0.4 MG CAPS capsule Take 0.4 mg by mouth daily.     No current facility-administered medications for this visit.    Allergies as of 04/15/2019 - Review Complete 04/15/2019  Allergen Reaction Noted  . Lisinopril Swelling     Family History  Problem Relation Age of Onset  . Dementia Mother   . Thyroid disease Mother   . Diabetes Mellitus II Mother   . Cervical cancer Mother   . Glaucoma Mother   . Other Father        low blood pressure  . Memory loss Father   . Colon cancer Neg Hx     Social History   Socioeconomic History  . Marital status: Married    Spouse name: Not on file  . Number of children: Not on file  . Years of education: Not on file  .  Highest education level: Not on file  Occupational History  . Not on file  Tobacco Use  . Smoking status: Former Smoker    Quit date: 03/20/1992    Years since quitting: 27.0  . Smokeless tobacco: Never Used  Substance and Sexual Activity  . Alcohol use: Yes    Comment: 1/2 pint per day  . Drug use: No  . Sexual activity: Not on file  Other Topics Concern  . Not on file  Social History Narrative   Lives with wife   Caffeine use: Coffee (30oz per day)   Right handed   Social Determinants of Health   Financial Resource Strain:   . Difficulty of Paying Living Expenses: Not on file  Food Insecurity:   . Worried About Charity fundraiser in the Last Year: Not on file  . Ran Out of Food in the Last Year:  Not on file  Transportation Needs:   . Lack of Transportation (Medical): Not on file  . Lack of Transportation (Non-Medical): Not on file  Physical Activity:   . Days of Exercise per Week: Not on file  . Minutes of Exercise per Session: Not on file  Stress:   . Feeling of Stress : Not on file  Social Connections:   . Frequency of Communication with Friends and Family: Not on file  . Frequency of Social Gatherings with Friends and Family: Not on file  . Attends Religious Services: Not on file  . Active Member of Clubs or Organizations: Not on file  . Attends Archivist Meetings: Not on file  . Marital Status: Not on file  Intimate Partner Violence:   . Fear of Current or Ex-Partner: Not on file  . Emotionally Abused: Not on file  . Physically Abused: Not on file  . Sexually Abused: Not on file    Review of Systems: General: Negative for anorexia, weight loss, fever, chills, fatigue, weakness. ENT: Negative for hoarseness, difficulty swallowing. CV: Negative for chest pain, angina, palpitations, peripheral edema.  Respiratory: Negative for dyspnea at rest, cough, sputum, wheezing.  GI: See history of present illness.  MS: Negative for joint pain, low back pain.  Derm: Negative for rash or itching.  Endo: Negative for unusual weight change.  Heme: Negative for bruising or bleeding. Allergy: Negative for rash or hives.    Physical Exam: BP (!) 160/76   Pulse 68   Temp (!) 96.8 F (36 C)   Ht 5\' 8"  (1.727 m)   Wt 227 lb 9.6 oz (103.2 kg)   BMI 34.61 kg/m  General:   Alert and oriented. Pleasant and cooperative. Well-nourished and well-developed.  Head:  Normocephalic and atraumatic. Eyes:  Without icterus, sclera clear and conjunctiva pink.  Ears:  Normal auditory acuity. Cardiovascular:  S1, S2 present without murmurs appreciated. Extremities without clubbing or edema. Respiratory:  Clear to auscultation bilaterally. No wheezes, rales, or rhonchi. No distress.   Gastrointestinal:  +BS, soft, non-tender and non-distended. No HSM noted. No guarding or rebound. No masses appreciated.  Rectal:  Deferred  Musculoskalatal:  Symmetrical without gross deformities. Neurologic:  Alert and oriented x4;  grossly normal neurologically. Psych:  Alert and cooperative. Normal mood and affect. Heme/Lymph/Immune: No excessive bruising noted.    04/15/2019 10:43 AM   Disclaimer: This note was dictated with voice recognition software. Similar sounding words can inadvertently be transcribed and may not be corrected upon review.

## 2019-04-15 NOTE — Assessment & Plan Note (Signed)
Patient has a personal history of colon polyps.  He has been getting a colonoscopy about every 5 years.  His last colonoscopy was in 2015 he is currently due.  We will request records of his last colonoscopy from [facility].  The patient states he had a couple very small polyps.  They recommended a 5-year repeat.  Explained to him at this time with the coronavirus surge that elective procedures are on hold and he is okay with this.  We will schedule more able to and proceed at that time.  In the meantime call for any worsening symptoms or any obvious GI bleed.

## 2019-06-13 ENCOUNTER — Telehealth: Payer: Self-pay | Admitting: Internal Medicine

## 2019-06-13 NOTE — Telephone Encounter (Signed)
[phone]  OR [phone]  PATIENT NEEDS TO RESCHEDULE PROCEDURE     CALL HOME NUMBER FIRST

## 2019-06-16 ENCOUNTER — Encounter: Payer: Self-pay | Admitting: *Deleted

## 2019-06-16 NOTE — Telephone Encounter (Signed)
Called pt. He needs procedure scheduled for a Monday. He has been rescheduled to 7/12 at 8:15am. Patient aware will mail new prep instructions with new pre-op/covid test appt. Called endo and LMOVM making aware

## 2019-07-15 ENCOUNTER — Other Ambulatory Visit (HOSPITAL_COMMUNITY): Payer: BC Managed Care – PPO

## 2019-08-12 ENCOUNTER — Ambulatory Visit: Payer: BC Managed Care – PPO | Admitting: Urology

## 2019-09-16 ENCOUNTER — Other Ambulatory Visit: Payer: Self-pay

## 2019-09-16 ENCOUNTER — Encounter: Payer: Self-pay | Admitting: Urology

## 2019-09-16 ENCOUNTER — Ambulatory Visit (INDEPENDENT_AMBULATORY_CARE_PROVIDER_SITE_OTHER): Payer: BC Managed Care – PPO | Admitting: Urology

## 2019-09-16 VITALS — BP 127/71 | HR 73 | Temp 98.2°F | Ht 69.0 in | Wt 225.0 lb

## 2019-09-16 DIAGNOSIS — R3911 Hesitancy of micturition: Secondary | ICD-10-CM

## 2019-09-16 DIAGNOSIS — N401 Enlarged prostate with lower urinary tract symptoms: Secondary | ICD-10-CM

## 2019-09-16 DIAGNOSIS — R972 Elevated prostate specific antigen [PSA]: Secondary | ICD-10-CM | POA: Diagnosis not present

## 2019-09-16 LAB — POCT URINALYSIS DIPSTICK
Bilirubin, UA: NEGATIVE
Blood, UA: NEGATIVE
Glucose, UA: NEGATIVE
Ketones, UA: NEGATIVE
Leukocytes, UA: NEGATIVE
Nitrite, UA: NEGATIVE
Protein, UA: NEGATIVE
Spec Grav, UA: <=1.005 — AB (ref 1.010–1.025)
Urobilinogen, UA: 0.2 E.U./dL
pH, UA: 6 (ref 5.0–8.0)

## 2019-09-16 MED ORDER — FINASTERIDE 5 MG PO TABS: 5.0000 mg | ORAL_TABLET | Freq: Every day | ORAL | 3 refills | Status: DC

## 2019-09-16 NOTE — Progress Notes (Signed)
H&P  Chief Complaint: Elevated PSA  History of Present Illness: Followup for elevated PSA.  2.11.2014: TRUS/Bx for PSA of 3.83. Prostate volume 56.4 ml. PSAD 0.07. All 12 cores negative. Not seen since then.   7.24.2020: Referred back by Dr Willey Blade. Most recent PSA 7.7 on 7.8.2020. PSA has been monitored per PCP and has recently spiked. He notes that he has also had weakened stream with "spurting" at the end of urination. He also notes that he has had hesitancy with full bladder but after walking around for a while is able to urinate with a very strong stream. He is interested in medication for his urination.   11.13.2020: Here for fusion TRUS/Bx. Recent MRI prostate--volume 71 ml.PI RADS 3 lesion left lateral peripheral zone, 1.2 cm. No SV/NV/bony involvement, pelvic LA. No transcapsular involvement. Volume 97 ml. All 4 cores from ROI neg, as well as routine 12 systematic cores.  6.29.2021: IPSS 23, QoL score 3. No recent PSA. LUTS somewhat bothersome.  He is on tamsulosin once a day.  He would be willing to try another medication.   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   Sleep apnea    CPAP    Past Surgical History:  Procedure Laterality Date   CARPAL TUNNEL RELEASE Bilateral    CATARACT EXTRACTION W/PHACO Left 01/07/2019   Procedure: CATARACT EXTRACTION PHACO AND INTRAOCULAR LENS PLACEMENT (IOC) LEFT  00:35.0  16.3%  5.70;  Surgeon: Birder Robson, MD;  Location: [place];  Service: Ophthalmology;  Laterality: Left;  sleep apnea   TONSILLECTOMY     Age 64   WISDOM TOOTH EXTRACTION     x 4    Home Medications:  Allergies as of 09/16/2019      Reactions   Lisinopril Swelling   lips      Medication List       Accurate as of September 16, 2019 10:18 AM. If you have any questions, ask your nurse or doctor.        Advair Diskus  100-50 MCG/DOSE Aepb Generic drug: Fluticasone-Salmeterol Inhale 1 puff into the lungs in the morning and at bedtime.   azelastine 0.1 % nasal spray Commonly known as: ASTELIN Place 1 spray into both nostrils daily. Use in each nostril as directed   fluticasone 50 MCG/ACT nasal spray Commonly known as: FLONASE Place 2 sprays into both nostrils daily.   losartan 100 MG tablet Commonly known as: COZAAR Take 100 mg by mouth daily.   montelukast 10 MG tablet Commonly known as: SINGULAIR Take 10 mg by mouth at bedtime.   omeprazole 20 MG capsule Commonly known as: PRILOSEC Take 20 mg by mouth daily.   simvastatin 20 MG tablet Commonly known as: ZOCOR Take 20 mg by mouth daily.   Spiriva HandiHaler 18 MCG inhalation capsule Generic drug: tiotropium Place 18 mcg into inhaler and inhale daily.   Spiriva Respimat 2.5 MCG/ACT Aers Generic drug: Tiotropium Bromide Monohydrate SMARTSIG:2 Puff(s) Via Inhaler Daily   tamsulosin 0.4 MG Caps capsule Commonly known as: FLOMAX Take 0.4 mg by mouth daily.       Allergies:  Allergies  Allergen Reactions   Lisinopril Swelling    lips    Family History  Problem Relation Age of Onset   Dementia Mother    Thyroid disease Mother    Diabetes Mellitus II Mother  Cervical cancer Mother    Glaucoma Mother    Other Father        low blood pressure   Memory loss Father    Colon cancer Neg Hx     Social History:  reports that he quit smoking about 27 years ago. He has never used smokeless tobacco. He reports current alcohol use. He reports that he does not use drugs.  ROS: A complete review of systems was performed.  All systems are negative except for pertinent findings as noted.  Physical Exam:  Vital signs in last 24 hours: BP 127/71    Pulse 73    Temp 98.2 F (36.8 C)    Ht 5\' 9"  (1.753 m)    Wt 225 lb (102.1 kg)    BMI 33.23 kg/m  Constitutional:  Alert and oriented, No acute distress Cardiovascular: Regular  rate  Respiratory: Normal respiratory effort Genitourinary: Normal male phallus, testes are descended bilaterally and non-tender and without masses, scrotum is normal in appearance without lesions or masses, perineum is normal on inspection. Can only feel the apex of prostate. Neurologic: Grossly intact, no focal deficits Psychiatric: Normal mood and affect  Results for orders placed or performed in visit on 09/16/19 (from the past 24 hour(s))  POCT urinalysis dipstick     Status: Abnormal   Collection Time: 09/16/19  9:48 AM  Result Value Ref Range   Color, UA yellow    Clarity, UA     Glucose, UA Negative Negative   Bilirubin, UA neg    Ketones, UA neg    Spec Grav, UA <=1.005 (A) 1.010 - 1.025   Blood, UA neg    pH, UA 6.0 5.0 - 8.0   Protein, UA Negative Negative   Urobilinogen, UA 0.2 0.2 or 1.0 E.U./dL   Nitrite, UA neg    Leukocytes, UA Negative Negative   Appearance clear    Odor     I have reviewed prior pt notes  I have reviewed notes from referring/previous physicians  I have reviewed urinalysis results  I have independently reviewed prior imaging  I have reviewed prior PSA results  Impression/Assessment:  1.  BPH with moderate symptomatology despite being on alpha-blocker.  He would be willing to try another medication  2.  Elevated PSA with negative biopsies x2  Plan:  1.  I have added finasteride to his medication regimen.  Side effects of the medication discussed  2.  In 3 months or so, try tapering off of tamsulosin  3.  I will see back in 6 months for recheck  4.  PSA checked today

## 2019-09-16 NOTE — Progress Notes (Signed)
See progress note.

## 2019-09-17 LAB — PSA: PSA: 6.5 ng/mL — ABNORMAL HIGH (ref <=4.0)

## 2019-09-24 NOTE — Progress Notes (Signed)
Results sent via my chart 

## 2019-09-26 ENCOUNTER — Other Ambulatory Visit (HOSPITAL_COMMUNITY)
Admission: RE | Admit: 2019-09-26 | Discharge: 2019-09-26 | Disposition: A | Discharge status: Home / Self Care | Payer: BC Managed Care – PPO | Source: Ambulatory Visit | Attending: Internal Medicine | Admitting: Internal Medicine

## 2019-09-26 ENCOUNTER — Telehealth: Payer: Self-pay | Admitting: Nurse Practitioner

## 2019-09-26 ENCOUNTER — Other Ambulatory Visit: Payer: Self-pay

## 2019-09-26 ENCOUNTER — Encounter (HOSPITAL_COMMUNITY)
Admission: RE | Admit: 2019-09-26 | Discharge: 2019-09-26 | Disposition: A | Discharge status: Home / Self Care | Payer: BC Managed Care – PPO | Source: Ambulatory Visit | Attending: Internal Medicine | Admitting: Internal Medicine

## 2019-09-26 DIAGNOSIS — Z01812 Encounter for preprocedural laboratory examination: Secondary | ICD-10-CM | POA: Diagnosis not present

## 2019-09-26 DIAGNOSIS — Z20822 Contact with and (suspected) exposure to covid-19: Secondary | ICD-10-CM | POA: Insufficient documentation

## 2019-09-26 LAB — SARS CORONAVIRUS 2 (TAT 6-24 HRS): SARS Coronavirus 2: NEGATIVE

## 2019-09-26 NOTE — Patient Instructions (Signed)
Your procedure is scheduled on: 09/29/2019  Report to [facility] at  6:30   AM.  Call this number if you have problems the morning of surgery: [phone]   Remember:              Follow Directions on the letter you received from Your Physician's office regarding the Bowel Prep              No Smoking the day of Procedure :   Take these medicines the morning of surgery with A SIP OF WATER: proscar, prilosec and flomax   Do not wear jewelry, make-up or nail polish.    Do not bring valuables to the hospital.  Contacts, dentures or bridgework may not be worn into surgery.  .   Patients discharged the day of surgery will not be allowed to drive home.     Colonoscopy, Adult, Care After This sheet gives you information about how to care for yourself after your procedure. Your health care provider may also give you more specific instructions. If you have problems or questions, contact your health care provider. What can I expect after the procedure? After the procedure, it is common to have:  A small amount of blood in your stool for 24 hours after the procedure.  Some gas.  Mild abdominal cramping or bloating.  Follow these instructions at home: General instructions   For the first 24 hours after the procedure: ? Do not drive or use machinery. ? Do not sign important documents. ? Do not drink alcohol. ? Do your regular daily activities at a slower pace than normal. ? Eat soft, easy-to-digest foods. ? Rest often.  Take over-the-counter or prescription medicines only as told by your health care provider.  It is up to you to get the results of your procedure. Ask your health care provider, or the department performing the procedure, when your results will be ready. Relieving cramping and bloating  Try walking around when you have cramps or feel bloated.  Apply heat to your abdomen as told by your health care provider. Use a heat source that your health care provider  recommends, such as a moist heat pack or a heating pad. ? Place a towel between your skin and the heat source. ? Leave the heat on for 20-30 minutes. ? Remove the heat if your skin turns bright red. This is especially important if you are unable to feel pain, heat, or cold. You may have a greater risk of getting burned. Eating and drinking  Drink enough fluid to keep your urine clear or pale yellow.  Resume your normal diet as instructed by your health care provider. Avoid heavy or fried foods that are hard to digest.  Avoid drinking alcohol for as long as instructed by your health care provider. Contact a health care provider if:  You have blood in your stool 2-3 days after the procedure. Get help right away if:  You have more than a small spotting of blood in your stool.  You pass large blood clots in your stool.  Your abdomen is swollen.  You have nausea or vomiting.  You have a fever.  You have increasing abdominal pain that is not relieved with medicine. This information is not intended to replace advice given to you by your health care provider. Make sure you discuss any questions you have with your health care provider. Document Released: 10/19/2003 Document Revised: 11/29/2015 Document Reviewed: 05/18/2015 Elsevier Interactive Patient Education  2018  [employer].

## 2019-09-26 NOTE — Progress Notes (Signed)
Eric Lam notified that patient's insurance does not cover [place].bowel prep.  Office to put new instructions for miralax bowel prep.  Patient has mChart.

## 2019-09-26 NOTE — Telephone Encounter (Signed)
Received call Friday afternoon. Patient states insurance will not cover his SuPrep. Procedure is Monday. JL helped with letter for MiraLAX prep instructions which was provided to preop who states they will call and ensure he can see it on MyChart and understands it.

## 2019-09-29 ENCOUNTER — Other Ambulatory Visit: Payer: Self-pay

## 2019-09-29 ENCOUNTER — Ambulatory Visit (HOSPITAL_COMMUNITY): Payer: BC Managed Care – PPO | Admitting: Anesthesiology

## 2019-09-29 ENCOUNTER — Encounter (HOSPITAL_COMMUNITY)
Admission: RE | Disposition: A | Discharge status: Home / Self Care | Payer: Self-pay | Source: Home / Self Care | Attending: Internal Medicine

## 2019-09-29 ENCOUNTER — Ambulatory Visit (HOSPITAL_COMMUNITY)
Admission: RE | Admit: 2019-09-29 | Discharge: 2019-09-29 | Disposition: A | Discharge status: Home / Self Care | Payer: BC Managed Care – PPO | Attending: Internal Medicine | Admitting: Internal Medicine

## 2019-09-29 ENCOUNTER — Encounter (HOSPITAL_COMMUNITY): Payer: Self-pay | Admitting: Internal Medicine

## 2019-09-29 DIAGNOSIS — Z87891 Personal history of nicotine dependence: Secondary | ICD-10-CM | POA: Insufficient documentation

## 2019-09-29 DIAGNOSIS — I10 Essential (primary) hypertension: Secondary | ICD-10-CM | POA: Insufficient documentation

## 2019-09-29 DIAGNOSIS — Z09 Encounter for follow-up examination after completed treatment for conditions other than malignant neoplasm: Secondary | ICD-10-CM | POA: Insufficient documentation

## 2019-09-29 DIAGNOSIS — D123 Benign neoplasm of transverse colon: Secondary | ICD-10-CM | POA: Insufficient documentation

## 2019-09-29 DIAGNOSIS — D120 Benign neoplasm of cecum: Secondary | ICD-10-CM | POA: Diagnosis not present

## 2019-09-29 DIAGNOSIS — Z79899 Other long term (current) drug therapy: Secondary | ICD-10-CM | POA: Insufficient documentation

## 2019-09-29 DIAGNOSIS — Z86010 Personal history of colonic polyps: Secondary | ICD-10-CM | POA: Diagnosis not present

## 2019-09-29 DIAGNOSIS — G4730 Sleep apnea, unspecified: Secondary | ICD-10-CM | POA: Insufficient documentation

## 2019-09-29 DIAGNOSIS — K635 Polyp of colon: Secondary | ICD-10-CM

## 2019-09-29 DIAGNOSIS — K219 Gastro-esophageal reflux disease without esophagitis: Secondary | ICD-10-CM | POA: Insufficient documentation

## 2019-09-29 DIAGNOSIS — K5730 Diverticulosis of large intestine without perforation or abscess without bleeding: Secondary | ICD-10-CM | POA: Diagnosis not present

## 2019-09-29 DIAGNOSIS — Q438 Other specified congenital malformations of intestine: Secondary | ICD-10-CM | POA: Insufficient documentation

## 2019-09-29 HISTORY — PX: COLONOSCOPY WITH PROPOFOL: SHX5780

## 2019-09-29 HISTORY — PX: POLYPECTOMY: SHX5525

## 2019-09-29 SURGERY — COLONOSCOPY WITH PROPOFOL
Anesthesia: General

## 2019-09-29 MED ORDER — PROPOFOL 10 MG/ML IV BOLUS
INTRAVENOUS | Status: DC | PRN
  Administered 2019-09-29: 60 mg via INTRAVENOUS

## 2019-09-29 MED ORDER — PROPOFOL 500 MG/50ML IV EMUL
INTRAVENOUS | Status: DC | PRN
  Administered 2019-09-29: 150 ug/kg/min via INTRAVENOUS

## 2019-09-29 MED ORDER — CHLORHEXIDINE GLUCONATE CLOTH 2 % EX PADS: 6.0000 | MEDICATED_PAD | Freq: Once | CUTANEOUS | Status: DC

## 2019-09-29 MED ORDER — LACTATED RINGERS IV SOLN: INTRAVENOUS | Status: DC | PRN

## 2019-09-29 MED ORDER — LACTATED RINGERS IV SOLN: INTRAVENOUS | Status: DC

## 2019-09-29 MED ORDER — PROPOFOL 10 MG/ML IV BOLUS
INTRAVENOUS | Status: AC
  Filled 2019-09-29: qty 40

## 2019-09-29 NOTE — Telephone Encounter (Signed)
noted 

## 2019-09-29 NOTE — Anesthesia Postprocedure Evaluation (Signed)
Anesthesia Post Note  Patient: Emmitt Matthews.  Procedure(s) Performed: COLONOSCOPY WITH PROPOFOL (N/A ) POLYPECTOMY  Patient location during evaluation: PACU Anesthesia Type: General Level of consciousness: awake, oriented, awake and alert and patient cooperative Pain management: satisfactory to patient Vital Signs Assessment: post-procedure vital signs reviewed and stable Respiratory status: spontaneous breathing, respiratory function stable and nonlabored ventilation Cardiovascular status: stable Postop Assessment: no apparent nausea or vomiting Anesthetic complications: no   No complications documented.   Last Vitals:  Vitals:   09/29/19 0709  BP: 136/84  Pulse: 83  Resp: 14  Temp: 37 C  SpO2: 97%    Last Pain:  Vitals:   09/29/19 0814  TempSrc:   PainSc: 0-No pain                 Sylvester Minton

## 2019-09-29 NOTE — Anesthesia Preprocedure Evaluation (Signed)
Anesthesia Evaluation  Patient identified by MRN, date of birth, ID band Patient awake    Reviewed: Allergy & Precautions, H&P , NPO status , Patient's Chart, lab work & pertinent test results, reviewed documented beta blocker date and time   Airway Mallampati: III  TM Distance: >3 FB Neck ROM: full    Dental no notable dental hx. (+) Teeth Intact   Pulmonary asthma , sleep apnea , former smoker,    Pulmonary exam normal breath sounds clear to auscultation       Cardiovascular Exercise Tolerance: Good hypertension, negative cardio ROS   Rhythm:regular Rate:Normal     Neuro/Psych negative neurological ROS  negative psych ROS   GI/Hepatic Neg liver ROS, GERD  Medicated,  Endo/Other  negative endocrine ROS  Renal/GU negative Renal ROS  negative genitourinary   Musculoskeletal   Abdominal   Peds  Hematology negative hematology ROS (+)   Anesthesia Other Findings   Reproductive/Obstetrics negative OB ROS                             Anesthesia Physical Anesthesia Plan  ASA: III  Anesthesia Plan: General   Post-op Pain Management:    Induction:   PONV Risk Score and Plan: 2 and Propofol infusion  Airway Management Planned:   Additional Equipment:   Intra-op Plan:   Post-operative Plan:   Informed Consent: I have reviewed the patients History and Physical, chart, labs and discussed the procedure including the risks, benefits and alternatives for the proposed anesthesia with the patient or authorized representative who has indicated his/her understanding and acceptance.     Dental Advisory Given  Plan Discussed with: CRNA  Anesthesia Plan Comments:         Anesthesia Quick Evaluation

## 2019-09-29 NOTE — Discharge Instructions (Signed)
Colonoscopy Discharge Instructions  Read the instructions outlined below and refer to this sheet in the next few weeks. These discharge instructions provide you with general information on caring for yourself after you leave the hospital. Your doctor may also give you specific instructions. While your treatment has been planned according to the most current medical practices available, unavoidable complications occasionally occur. If you have any problems or questions after discharge, call Dr. Gala Romney at [phone]. ACTIVITY  You may resume your regular activity, but move at a slower pace for the next 24 hours.   Take frequent rest periods for the next 24 hours.   Walking will help get rid of the air and reduce the bloated feeling in your belly (abdomen).   No driving for 24 hours (because of the medicine (anesthesia) used during the test).    Do not sign any important legal documents or operate any machinery for 24 hours (because of the anesthesia used during the test).  NUTRITION  Drink plenty of fluids.   You may resume your normal diet as instructed by your doctor.   Begin with a light meal and progress to your normal diet. Heavy or fried foods are harder to digest and may make you feel sick to your stomach (nauseated).   Avoid alcoholic beverages for 24 hours or as instructed.  MEDICATIONS  You may resume your normal medications unless your doctor tells you otherwise.  WHAT YOU CAN EXPECT TODAY  Some feelings of bloating in the abdomen.   Passage of more gas than usual.   Spotting of blood in your stool or on the toilet paper.  IF YOU HAD POLYPS REMOVED DURING THE COLONOSCOPY:  No aspirin products for 7 days or as instructed.   No alcohol for 7 days or as instructed.   Eat a soft diet for the next 24 hours.  FINDING OUT THE RESULTS OF YOUR TEST Not all test results are available during your visit. If your test results are not back during the visit, make an appointment  with your caregiver to find out the results. Do not assume everything is normal if you have not heard from your caregiver or the medical facility. It is important for you to follow up on all of your test results.  SEEK IMMEDIATE MEDICAL ATTENTION IF:  You have more than a spotting of blood in your stool.   Your belly is swollen (abdominal distention).   You are nauseated or vomiting.   You have a temperature over 101.   You have abdominal pain or discomfort that is severe or gets worse throughout the day.    Colon polyp and diverticulosis information provided  6 small polyps removed in your colon today  Further recommendations to follow pending review of pathology report  At patient request, I called Jimmy Allred at [phone] -reviewed results   Colon Polyps  Polyps are tissue growths inside the body. Polyps can grow in many places, including the large intestine (colon). A polyp may be a round bump or a mushroom-shaped growth. You could have one polyp or several. Most colon polyps are noncancerous (benign). However, some colon polyps can become cancerous over time. Finding and removing the polyps early can help prevent this. What are the causes? The exact cause of colon polyps is not known. What increases the risk? You are more likely to develop this condition if you:  Have a family history of colon cancer or colon polyps.  Are older than 50 or older than 45  if you are African American.  Have inflammatory bowel disease, such as ulcerative colitis or Crohn's disease.  Have certain hereditary conditions, such as: ? Familial adenomatous polyposis. ? Lynch syndrome. ? Turcot syndrome. ? Peutz-Jeghers syndrome.  Are overweight.  Smoke cigarettes.  Do not get enough exercise.  Drink too much alcohol.  Eat a diet that is high in fat and red meat and low in fiber.  Had childhood cancer that was treated with abdominal radiation. What are the signs or symptoms? Most  polyps do not cause symptoms. If you have symptoms, they may include:  Blood coming from your rectum when having a bowel movement.  Blood in your stool. The stool may look dark red or black.  Abdominal pain.  A change in bowel habits, such as constipation or diarrhea. How is this diagnosed? This condition is diagnosed with a colonoscopy. This is a procedure in which a lighted, flexible scope is inserted into the anus and then passed into the colon to examine the area. Polyps are sometimes found when a colonoscopy is done as part of routine cancer screening tests. How is this treated? Treatment for this condition involves removing any polyps that are found. Most polyps can be removed during a colonoscopy. Those polyps will then be tested for cancer. Additional treatment may be needed depending on the results of testing. Follow these instructions at home: Lifestyle  Maintain a healthy weight, or lose weight if recommended by your health care provider.  Exercise every day or as told by your health care provider.  Do not use any products that contain nicotine or tobacco, such as cigarettes and e-cigarettes. If you need help quitting, ask your health care provider.  If you drink alcohol, limit how much you have: ? 0-1 drink a day for women. ? 0-2 drinks a day for men.  Be aware of how much alcohol is in your drink. In the [place] one drink equals one 12 oz bottle of beer (355 mL), one 5 oz glass of wine (148 mL), or one 1 oz shot of hard liquor (44 mL). Eating and drinking   Eat foods that are high in fiber, such as fruits, vegetables, and whole grains.  Eat foods that are high in calcium and vitamin D, such as milk, cheese, yogurt, eggs, liver, fish, and broccoli.  Limit foods that are high in fat, such as fried foods and desserts.  Limit the amount of red meat and processed meat you eat, such as hot dogs, sausage, bacon, and lunch meats. General instructions  Keep all follow-up  visits as told by your health care provider. This is important. ? This includes having regularly scheduled colonoscopies. ? Talk to your health care provider about when you need a colonoscopy. Contact a health care provider if:  You have new or worsening bleeding during a bowel movement.  You have new or increased blood in your stool.  You have a change in bowel habits.  You lose weight for no known reason. Summary  Polyps are tissue growths inside the body. Polyps can grow in many places, including the colon.  Most colon polyps are noncancerous (benign), but some can become cancerous over time.  This condition is diagnosed with a colonoscopy.  Treatment for this condition involves removing any polyps that are found. Most polyps can be removed during a colonoscopy. This information is not intended to replace advice given to you by your health care provider. Make sure you discuss any questions you have with your  health care provider. Document Revised: 06/21/2017 Document Reviewed: 06/21/2017 Elsevier Patient Education  [place].   Diverticulosis  Diverticulosis is a condition that develops when small pouches (diverticula) form in the wall of the large intestine (colon). The colon is where water is absorbed and stool (feces) is formed. The pouches form when the inside layer of the colon pushes through weak spots in the outer layers of the colon. You may have a few pouches or many of them. The pouches usually do not cause problems unless they become inflamed or infected. When this happens, the condition is called diverticulitis. What are the causes? The cause of this condition is not known. What increases the risk? The following factors may make you more likely to develop this condition:  Being older than age 12. Your risk for this condition increases with age. Diverticulosis is rare among people younger than age 66. By age 38, many people have it.  Eating a low-fiber  diet.  Having frequent constipation.  Being overweight.  Not getting enough exercise.  Smoking.  Taking over-the-counter pain medicines, like aspirin and ibuprofen.  Having a family history of diverticulosis. What are the signs or symptoms? In most people, there are no symptoms of this condition. If you do have symptoms, they may include:  Bloating.  Cramps in the abdomen.  Constipation or diarrhea.  Pain in the lower left side of the abdomen. How is this diagnosed? Because diverticulosis usually has no symptoms, it is most often diagnosed during an exam for other colon problems. The condition may be diagnosed by:  Using a flexible scope to examine the colon (colonoscopy).  Taking an X-ray of the colon after dye has been put into the colon (barium enema).  Having a CT scan. How is this treated? You may not need treatment for this condition. Your health care provider may recommend treatment to prevent problems. You may need treatment if you have symptoms or if you previously had diverticulitis. Treatment may include:  Eating a high-fiber diet.  Taking a fiber supplement.  Taking a live bacteria supplement (probiotic).  Taking medicine to relax your colon. Follow these instructions at home: Medicines  Take over-the-counter and prescription medicines only as told by your health care provider.  If told by your health care provider, take a fiber supplement or probiotic. Constipation prevention Your condition may cause constipation. To prevent or treat constipation, you may need to:  Drink enough fluid to keep your urine pale yellow.  Take over-the-counter or prescription medicines.  Eat foods that are high in fiber, such as beans, whole grains, and fresh fruits and vegetables.  Limit foods that are high in fat and processed sugars, such as fried or sweet foods.  General instructions  Try not to strain when you have a bowel movement.  Keep all follow-up visits  as told by your health care provider. This is important. Contact a health care provider if you:  Have pain in your abdomen.  Have bloating.  Have cramps.  Have not had a bowel movement in 3 days. Get help right away if:  Your pain gets worse.  Your bloating becomes very bad.  You have a fever or chills, and your symptoms suddenly get worse.  You vomit.  You have bowel movements that are bloody or black.  You have bleeding from your rectum. Summary  Diverticulosis is a condition that develops when small pouches (diverticula) form in the wall of the large intestine (colon).  You may have a  few pouches or many of them.  This condition is most often diagnosed during an exam for other colon problems.  Treatment may include increasing the fiber in your diet, taking supplements, or taking medicines.   Monitored Anesthesia Care, Care After These instructions provide you with information about caring for yourself after your procedure. Your health care provider may also give you more specific instructions. Your treatment has been planned according to current medical practices, but problems sometimes occur. Call your health care provider if you have any problems or questions after your procedure. What can I expect after the procedure? After your procedure, you may:  Feel sleepy for several hours.  Feel clumsy and have poor balance for several hours.  Feel forgetful about what happened after the procedure.  Have poor judgment for several hours.  Feel nauseous or vomit.  Have a sore throat if you had a breathing tube during the procedure. Follow these instructions at home: For at least 24 hours after the procedure:      Have a responsible adult stay with you. It is important to have someone help care for you until you are awake and alert.  Rest as needed.  Do not: ? Participate in activities in which you could fall or become injured. ? Drive. ? Use heavy  machinery. ? Drink alcohol. ? Take sleeping pills or medicines that cause drowsiness. ? Make important decisions or sign legal documents. ? Take care of children on your own. Eating and drinking  Follow the diet that is recommended by your health care provider.  If you vomit, drink water, juice, or soup when you can drink without vomiting.  Make sure you have little or no nausea before eating solid foods. General instructions  Take over-the-counter and prescription medicines only as told by your health care provider.  If you have sleep apnea, surgery and certain medicines can increase your risk for breathing problems. Follow instructions from your health care provider about wearing your sleep device: ? Anytime you are sleeping, including during daytime naps. ? While taking prescription pain medicines, sleeping medicines, or medicines that make you drowsy.  If you smoke, do not smoke without supervision.  Keep all follow-up visits as told by your health care provider. This is important. Contact a health care provider if:  You keep feeling nauseous or you keep vomiting.  You feel light-headed.  You develop a rash.  You have a fever. Get help right away if:  You have trouble breathing. Summary  For several hours after your procedure, you may feel sleepy and have poor judgment.  Have a responsible adult stay with you for at least 24 hours or until you are awake and alert. This information is not intended to replace advice given to you by your health care provider. Make sure you discuss any questions you have with your health care provider. Document Revised: 06/04/2017 Document Reviewed: 06/27/2015 Elsevier Patient Education  [employer].  This information is not intended to replace advice given to you by your health care provider. Make sure you discuss any questions you have with your health care provider. Document Revised: 10/03/2018 Document Reviewed:  10/03/2018 Elsevier Patient Education  [place].

## 2019-09-29 NOTE — Transfer of Care (Signed)
Immediate Anesthesia Transfer of Care Note  Patient: Eric Lam.  Procedure(s) Performed: COLONOSCOPY WITH PROPOFOL (N/A ) POLYPECTOMY  Patient Location: PACU  Anesthesia Type:General  Level of Consciousness: awake, alert , oriented and patient cooperative  Airway & Oxygen Therapy: Patient Spontanous Breathing  Post-op Assessment: Report given to RN, Post -op Vital signs reviewed and stable and Patient moving all extremities X 4  Post vital signs: Reviewed and stable  Last Vitals:  Vitals Value Taken Time  BP    Temp    Pulse 87 09/29/19 0846  Resp    SpO2 100 % 09/29/19 0846  Vitals shown include unvalidated device data.  Last Pain:  Vitals:   09/29/19 0814  TempSrc:   PainSc: 0-No pain      Patients Stated Pain Goal: 5 (76/14/70 [id number])  Complications: No complications documented.

## 2019-09-29 NOTE — Op Note (Signed)
[facility] Patient Name: Eric Lam Procedure Date: 09/29/2019 7:57 AM MRN: [id number] Date of Birth: Dec 05, 1955 Attending MD: Norvel Richards , MD CSN: [id number] Age: 64 Admit Type: Outpatient Procedure:                Colonoscopy Indications:              High risk colon cancer surveillance: Personal                            history of colonic polyps Providers:                Norvel Richards, MD, Rosina Lowenstein, RN, Crystal                            Page, Nelma Rothman, Technician Referring MD:              Medicines:                Propofol per Anesthesia Complications:            No immediate complications. Estimated Blood Loss:     Estimated blood loss was minimal. Procedure:                Pre-Anesthesia Assessment:                           - Prior to the procedure, a History and Physical                            was performed, and patient medications and                            allergies were reviewed. The patient's tolerance of                            previous anesthesia was also reviewed. The risks                            and benefits of the procedure and the sedation                            options and risks were discussed with the patient.                            All questions were answered, and informed consent                            was obtained. Prior Anticoagulants: The patient has                            taken no previous anticoagulant or antiplatelet                            agents. ASA Grade Assessment: III - A patient with  severe systemic disease. After reviewing the risks                            and benefits, the patient was deemed in                            satisfactory condition to undergo the procedure.                           After obtaining informed consent, the colonoscope                            was passed under direct vision. Throughout the                             procedure, the patient's blood pressure, pulse, and                            oxygen saturations were monitored continuously. The                            CF-[id number] ([id number]) scope was introduced through                            the anus and advanced to the the cecum, identified                            by appendiceal orifice and ileocecal valve. The                            colonoscopy was performed without difficulty. The                            patient tolerated the procedure well. The quality                            of the bowel preparation was adequate. Scope In: 8:20:20 AM Scope Out: 8:41:30 AM Scope Withdrawal Time: 0 hours 10 minutes 7 seconds  Total Procedure Duration: 0 hours 21 minutes 10 seconds  Findings:      The perianal and digital rectal examinations were normal.      Six sessile polyps were found in the hepatic flexure and cecum. The       polyps were 4 to 6 mm in size. These polyps were removed with a cold       snare. Resection and retrieval were complete. Estimated blood loss was       minimal.      Scattered medium-mouthed diverticula were found in the sigmoid colon and       descending colon. Colon somewhat redundant. External abdominal pressure       required to reach the cecum.      The exam was otherwise without abnormality on direct and retroflexion       views. Impression:               - Six 4 to 6 mm polyps at the  hepatic flexure and                            in the cecum, removed with a cold snare. Resected                            and retrieved.                           - Diverticulosis in the sigmoid colon and in the                            descending colon. Redundant colon.                           - The examination was otherwise normal on direct                            and retroflexion views. Moderate Sedation:      Moderate (conscious) sedation was personally administered by an       anesthesia professional. The following  parameters were monitored: oxygen       saturation, heart rate, blood pressure, respiratory rate, EKG, adequacy       of pulmonary ventilation, and response to care. Recommendation:           - Patient has a contact number available for                            emergencies. The signs and symptoms of potential                            delayed complications were discussed with the                            patient. Return to normal activities tomorrow.                            Written discharge instructions were provided to the                            patient.                           - Resume previous diet.                           - Continue present medications.                           - Repeat colonoscopy date to be determined after                            pending pathology results are reviewed for                            surveillance based on pathology results.                           -  Return to GI office (date not yet determined). Procedure Code(s):        --- Professional ---                           [phone], Colonoscopy, flexible; with removal of                            tumor(s), polyp(s), or other lesion(s) by snare                            technique Diagnosis Code(s):        --- Professional ---                           Z86.010, Personal history of colonic polyps                           K63.5, Polyp of colon                           K57.30, Diverticulosis of large intestine without                            perforation or abscess without bleeding CPT copyright 2019 American Medical Association. All rights reserved. The codes documented in this report are preliminary and upon coder review may  be revised to meet current compliance requirements. Cristopher Estimable. Sherry Blackard, MD Norvel Richards, MD 09/29/2019 8:50:21 AM This report has been signed electronically. Number of Addenda: 0

## 2019-09-29 NOTE — H&P (Signed)
@LOGO @   Primary Care Physician:  Asencion Noble, MD Primary Gastroenterologist:  Dr. Gala Romney  Pre-Procedure History & Physical: HPI:  Eric Lam. is a 64 y.o. male here for surveillance colonoscopy.  History of multiple colonic polyps removed over multiple colonoscopies elsewhere.  Last one in [facility] 2015.  Patient presents for surveillance colonoscopy.  No bowel symptoms currently.  Past Medical History:  Diagnosis Date  . Asthma   . GERD (gastroesophageal reflux disease)   . High blood pressure   . High cholesterol   . Memory disorder 10/03/2016  . Sleep apnea    CPAP    Past Surgical History:  Procedure Laterality Date  . CARPAL TUNNEL RELEASE Bilateral   . CATARACT EXTRACTION W/PHACO Left 01/07/2019   Procedure: CATARACT EXTRACTION PHACO AND INTRAOCULAR LENS PLACEMENT (IOC) LEFT  00:35.0  16.3%  5.70;  Surgeon: Birder Robson, MD;  Location: [place];  Service: Ophthalmology;  Laterality: Left;  sleep apnea  . TONSILLECTOMY     Age 48  . WISDOM TOOTH EXTRACTION     x 4    Prior to Admission medications   Medication Sig Start Date End Date Taking? Authorizing Provider  ADVAIR DISKUS 100-50 MCG/DOSE AEPB Inhale 1 puff into the lungs in the morning and at bedtime.  09/15/16  Yes [provider]  azelastine (ASTELIN) 0.1 % nasal spray Place 1 spray into both nostrils daily. Use in each nostril as directed   Yes [provider]  finasteride (PROSCAR) 5 MG tablet Take 1 tablet (5 mg total) by mouth daily. 09/16/19  Yes Dahlstedt, Annie Main, MD  fluticasone (FLONASE) 50 MCG/ACT nasal spray Place 2 sprays into both nostrils daily.  09/05/16  Yes [provider]  losartan (COZAAR) 100 MG tablet Take 100 mg by mouth daily.  08/15/16  Yes [provider]  montelukast (SINGULAIR) 10 MG tablet Take 10 mg by mouth at bedtime.  09/18/16  Yes [provider]  omeprazole (PRILOSEC) 20 MG capsule Take 20 mg by mouth daily.  09/11/16  Yes  [provider]  simvastatin (ZOCOR) 20 MG tablet Take 20 mg by mouth daily.  08/15/16  Yes [provider]  SPIRIVA HANDIHALER 18 MCG inhalation capsule Place 18 mcg into inhaler and inhale daily.  09/15/16  Yes [provider]  tamsulosin (FLOMAX) 0.4 MG CAPS capsule Take 0.4 mg by mouth daily.   Yes [provider]  SPIRIVA RESPIMAT 2.5 MCG/ACT AERS SMARTSIG:2 Puff(s) Via Inhaler Daily 09/08/19   [provider]    Allergies as of 04/15/2019 - Review Complete 04/15/2019  Allergen Reaction Noted  . Lisinopril Swelling     Family History  Problem Relation Age of Onset  . Dementia Mother   . Thyroid disease Mother   . Diabetes Mellitus II Mother   . Cervical cancer Mother   . Glaucoma Mother   . Other Father        low blood pressure  . Memory loss Father   . Colon cancer Neg Hx     Social History   Socioeconomic History  . Marital status: Married    Spouse name: Not on file  . Number of children: Not on file  . Years of education: Not on file  . Highest education level: Not on file  Occupational History  . Not on file  Tobacco Use  . Smoking status: Former Smoker    Quit date: 03/20/1992    Years since quitting: 27.5  . Smokeless tobacco: Never  Used  Vaping Use  . Vaping Use: Never used  Substance and Sexual Activity  . Alcohol use: Yes    Comment: 1/2 pint per day  . Drug use: No  . Sexual activity: Not on file  Other Topics Concern  . Not on file  Social History Narrative   Lives with wife   Caffeine use: Coffee (30oz per day)   Right handed   Social Determinants of Health   Financial Resource Strain:   . Difficulty of Paying Living Expenses:   Food Insecurity:   . Worried About Charity fundraiser in the Last Year:   . Arboriculturist in the Last Year:   Transportation Needs:   . Film/video editor (Medical):   Marland Kitchen Lack of Transportation (Non-Medical):   Physical Activity:   . Days of Exercise per Week:    . Minutes of Exercise per Session:   Stress:   . Feeling of Stress :   Social Connections:   . Frequency of Communication with Friends and Family:   . Frequency of Social Gatherings with Friends and Family:   . Attends Religious Services:   . Active Member of Clubs or Organizations:   . Attends Archivist Meetings:   Marland Kitchen Marital Status:   Intimate Partner Violence:   . Fear of Current or Ex-Partner:   . Emotionally Abused:   Marland Kitchen Physically Abused:   . Sexually Abused:     Review of Systems: See HPI, otherwise negative ROS  Physical Exam: BP 136/84   Pulse 83   Temp 98.6 F (37 C) (Oral)   Resp 14   Ht 5\' 9"  (1.753 m)   Wt 102.1 kg   SpO2 97%   BMI 33.24 kg/m  General:   Alert,  Well-developed, well-nourished, pleasant and cooperative in NAD SNeck:  Supple; no masses or thyromegaly. No significant cervical adenopathy. Lungs:  Clear throughout to auscultation.   No wheezes, crackles, or rhonchi. No acute distress. Heart:  Regular rate and rhythm; no murmurs, clicks, rubs,  or gallops. Abdomen: Non-distended, normal bowel sounds.  Soft and nontender without appreciable mass or hepatosplenomegaly.  Pulses:  Normal pulses noted. Extremities:  Without clubbing or edema.  Impression/Plan: 64 year old gentleman here for surveillance colonoscopy.  History of colonic polyps.  I have offered the patient a surveillance colonoscopy today per plan. The risks, benefits, limitations, alternatives and imponderables have been reviewed with the patient. Questions have been answered. All parties are agreeable.      Notice: This dictation was prepared with Dragon dictation along with smaller phrase technology. Any transcriptional errors that result from this process are unintentional and may not be corrected upon review.

## 2019-09-29 NOTE — Telephone Encounter (Signed)
Noted  

## 2019-09-30 ENCOUNTER — Encounter: Payer: Self-pay | Admitting: Internal Medicine

## 2019-09-30 LAB — SURGICAL PATHOLOGY

## 2019-10-03 ENCOUNTER — Encounter (HOSPITAL_COMMUNITY): Payer: Self-pay | Admitting: Internal Medicine

## 2020-02-04 IMAGING — MR MR PROSTATE WO/W CM
23 of 54 series · 23 of 54 positions shown · IV contrast (gadavist)
Comparison: None.

CLINICAL DATA: Elevated PSA.  Negative biopsy.

EXAM:
MR PROSTATE WITHOUT AND WITH CONTRAST
TECHNIQUE: Multiplanar multisequence MRI images were obtained of the pelvis
centered about the prostate. Pre and post contrast images were
obtained.
CONTRAST:  10mL GADAVIST GADOBUTROL 1 MMOL/ML IV SOLN

[Series 3: bSSFP fat-sat · axial · 6.0mm · 0.86mm/px · 1 of 50 slices shown]
[im 1/50]
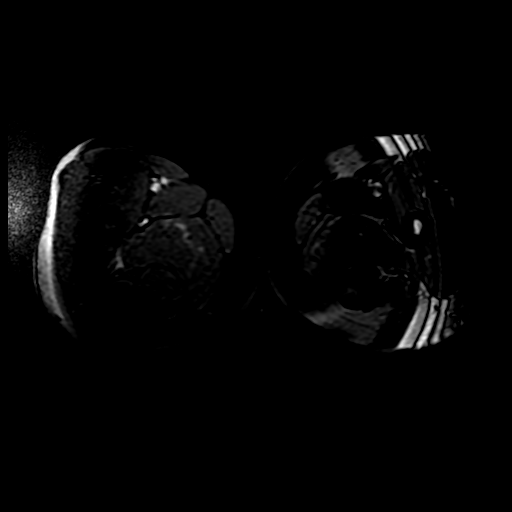

[Series 4: T1 · axial · 6.0mm · 0.86mm/px · 1 of 44 slices shown (1 of 2)]
[im 1/44]
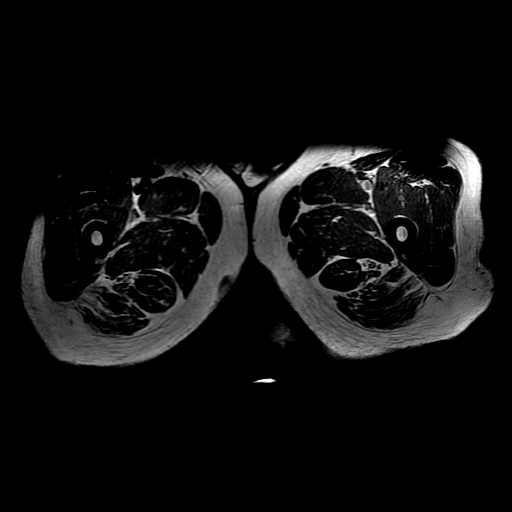

[Series 5: T2 · axial · 3.0mm · 0.29mm/px · 1 of 32 slices shown (1 of 4)]
[im 1/32]
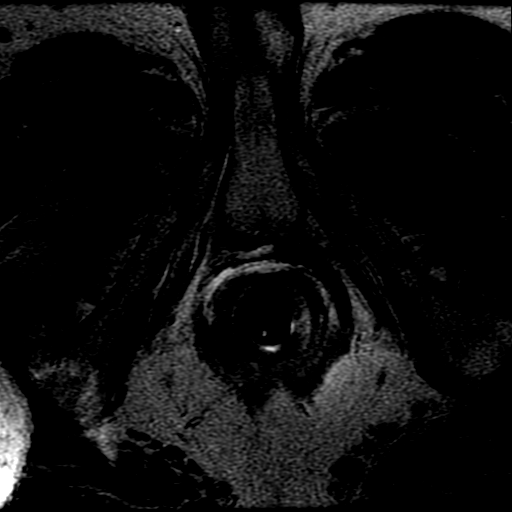

[Series 6: T1 · axial · 3.0mm · 0.29mm/px · 1 of 32 slices shown (2 of 2)]
[im 1/32]
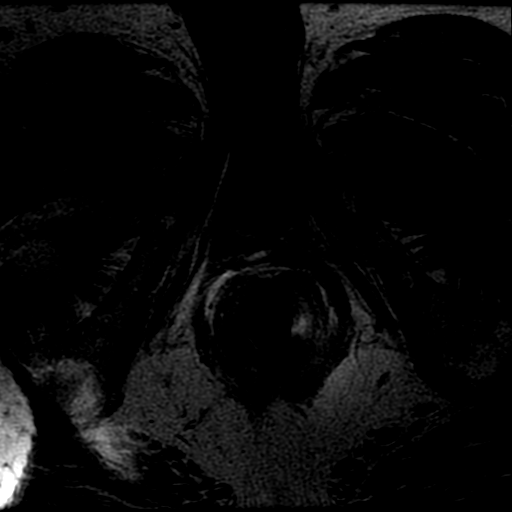

[Series 7: T2 · axial · 1.8mm · 0.47mm/px · 1 of 156 slices shown (2 of 4)]
[im 1/156]
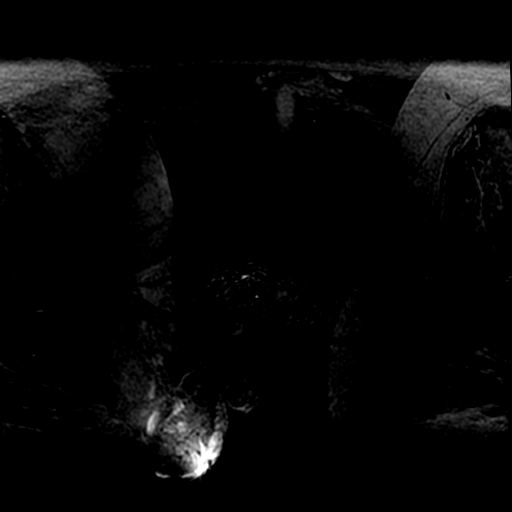

[Series 8: T2 · sagittal · 4.0mm · 0.29mm/px · 1 of 26 slices shown (3 of 4)]
[im 1/26]
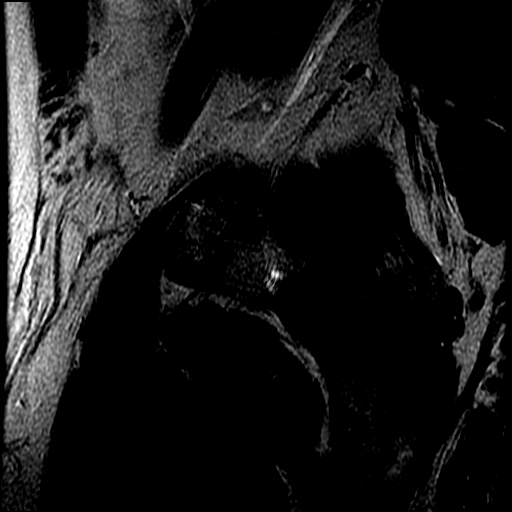

[Series 9: T2 · coronal · 4.0mm · 0.29mm/px · 1 of 24 slices shown (4 of 4)]
[im 1/24]
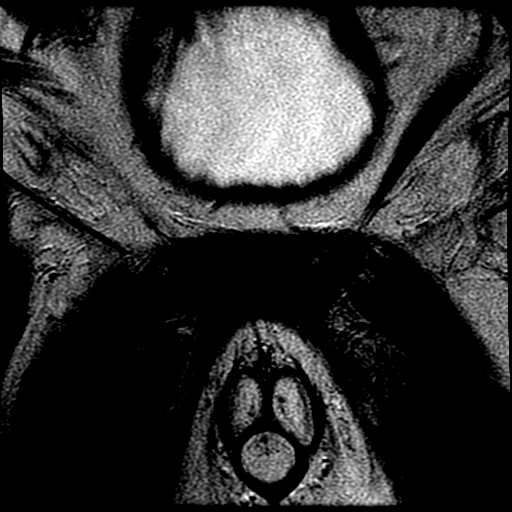

[Series 10: DWI · axial · 3.0mm · 0.59mm/px · 1 of 64 slices shown (1 of 6)]
[im 1/64]
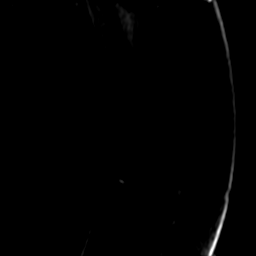

[Series 11: DWI · axial · 3.0mm · 0.59mm/px · 1 of 64 slices shown (2 of 6)]
[im 1/64]
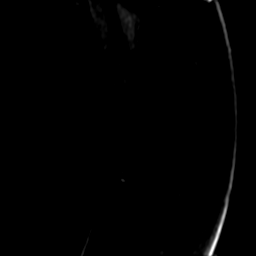

[Series 12: DWI · axial · 3.0mm · 0.59mm/px · 1 of 64 slices shown (3 of 6)]
[im 1/64]
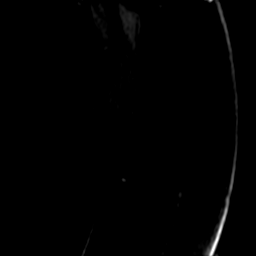

[Series 1000: DWI · axial · 3.0mm · 0.59mm/px · 1 of 32 slices shown (4 of 6)]
[im 1/32]
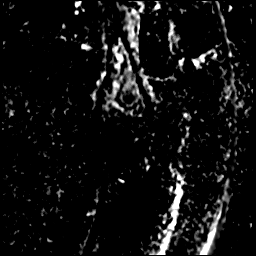

[Series 1100: DWI · axial · 3.0mm · 0.59mm/px · 1 of 32 slices shown (5 of 6)]
[im 1/32]
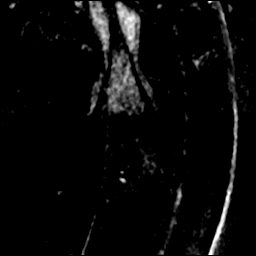

[Series 1200: DWI · axial · 3.0mm · 0.59mm/px · 1 of 32 slices shown (6 of 6)]
[im 1/32]
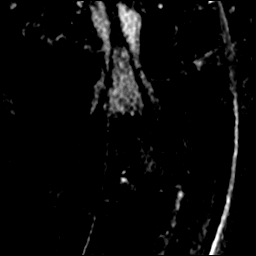

[((id)/(id)/84)-((id)/(id)/84) · axial · 3.0mm · 0.43mm/px · 1 of 84 slices shown (1 of 10)]
[im 1/84]
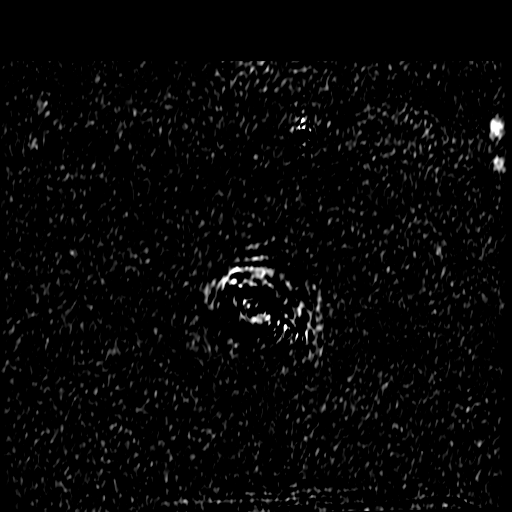

[((id)/(id)/84)-((id)/(id)/84) · axial · 3.0mm · 0.43mm/px · 1 of 84 slices shown (2 of 10)]
[im 1/84]
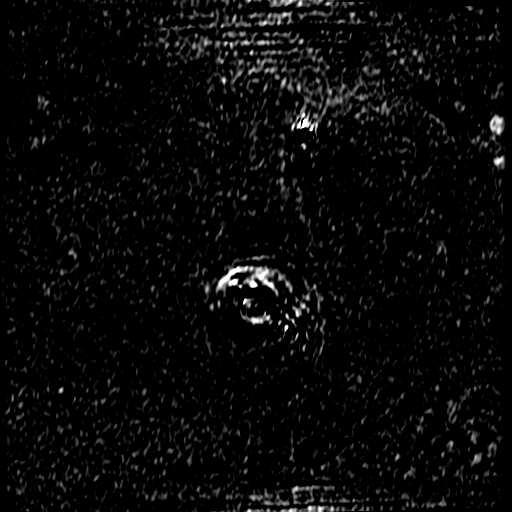

[((id)/(id)/84)-((id)/(id)/84) · axial · 3.0mm · 0.43mm/px · 1 of 84 slices shown (3 of 10)]
[im 1/84]
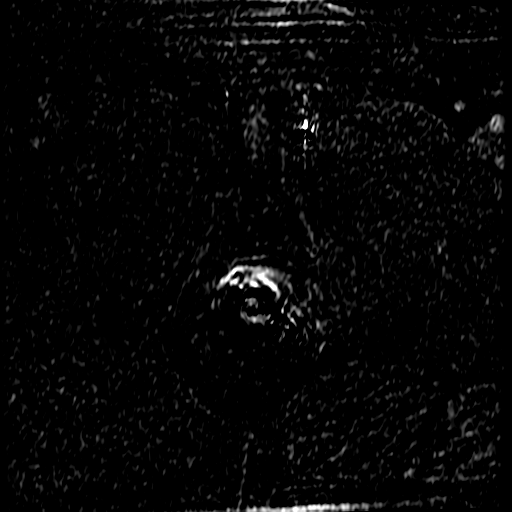

[((id)/(id)/84)-((id)/(id)/84) · axial · 3.0mm · 0.43mm/px · 1 of 84 slices shown (4 of 10)]
[im 1/84]
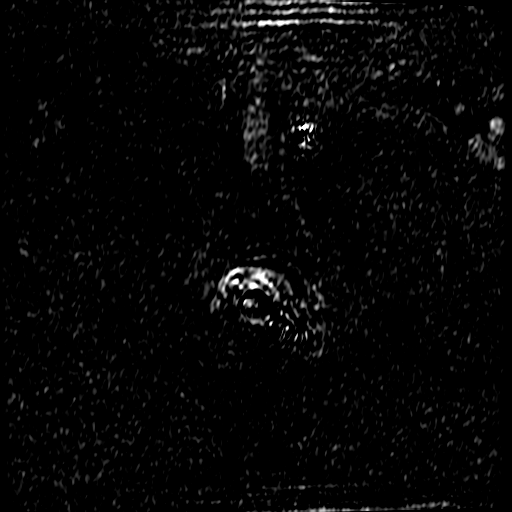

[((id)/(id)/84)-((id)/(id)/84) · axial · 3.0mm · 0.43mm/px · 1 of 84 slices shown (5 of 10)]
[im 1/84]
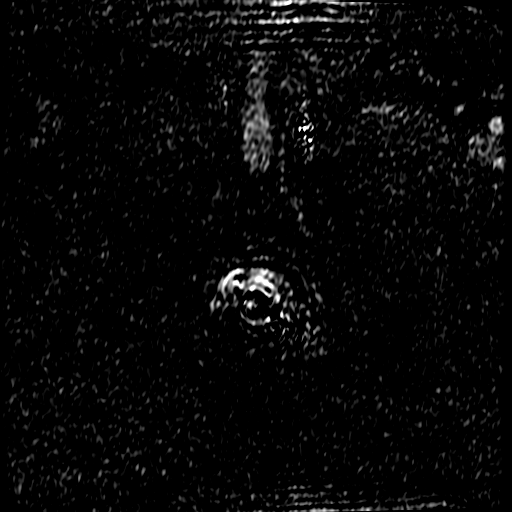

[((id)/(id)/84)-((id)/(id)/84) · axial · 3.0mm · 0.43mm/px · 1 of 84 slices shown (6 of 10)]
[im 1/84]
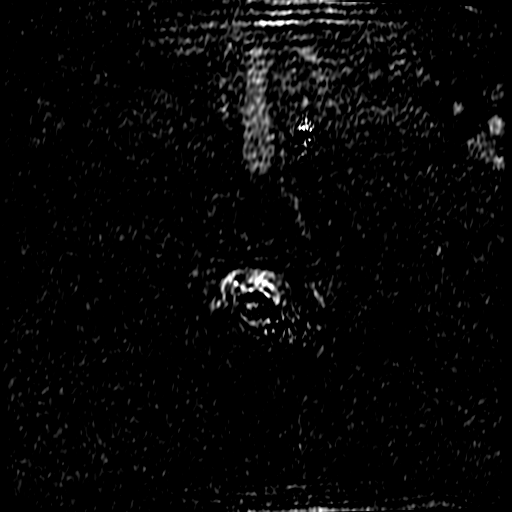

[((id)/(id)/84)-((id)/(id)/84) · axial · 3.0mm · 0.43mm/px · 1 of 84 slices shown (7 of 10)]
[im 1/84]
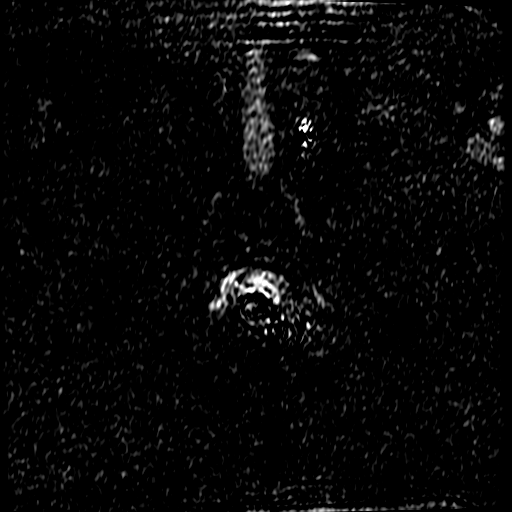

[((id)/(id)/84)-((id)/(id)/84) · axial · 3.0mm · 0.43mm/px · 1 of 84 slices shown (8 of 10)]
[im 1/84]
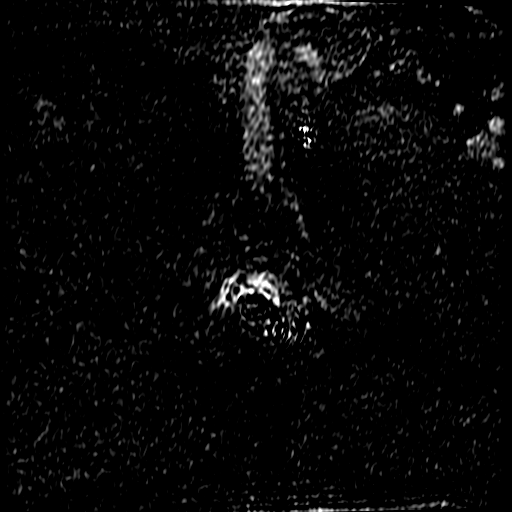

[((id)/(id)/84)-((id)/(id)/84) · axial · 3.0mm · 0.43mm/px · 1 of 84 slices shown (9 of 10)]
[im 1/84]
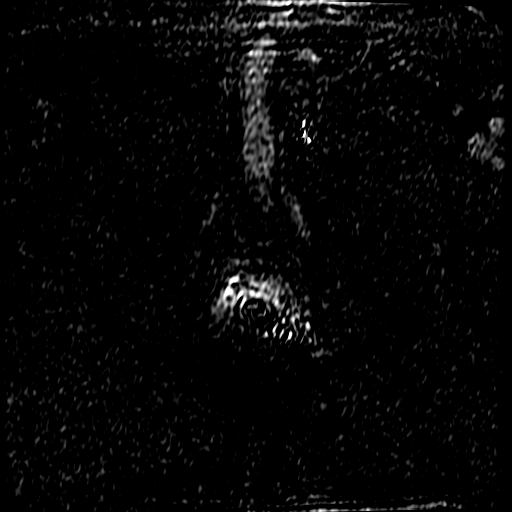

[((id)/(id)/84)-((id)/(id)/84) · axial · 3.0mm · 0.43mm/px · 1 of 83 slices shown (10 of 10)]
[im 1/83]
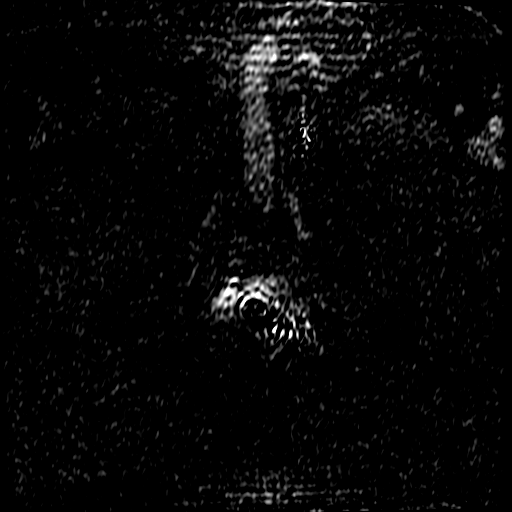

[23 of 54 positions shown; findings below may reference images not displayed]

FINDINGS: Mild artifact degradation, likely due to motion/bowel gas and
patient size

Prostate: Demonstrates moderate central gland enlargement and
heterogeneity, consistent with benign prostatic hyperplasia. No
dominant or suspicious central gland nodule.

Mildly heterogeneous T2 signal within the lateral mid peripheral
zone bilaterally, including on [DATE]. Somewhat more focal area of
left T2 hypointensity at maximally 1.2 cm which may correspond to
restricted diffusion on images 13 and 14 of series 2233. No
convincing evidence of early post-contrast enhancement.

Volume: 6.0 x 4.2 x 5.4 cm (volume = 71 cm^3)

Transcapsular spread:  Absent

Seminal vesicle involvement: Absent

Neurovascular bundle involvement: Absent

Pelvic adenopathy: Absent

Bone metastasis: Absent

Other findings: No significant free fluid. Normal urinary bladder.
Small fat containing left inguinal hernia.
IMPRESSION: 1. Mild degradation, as detailed above.
2. Lateral left mid peripheral zone 1.2 cm focus of T2
hypointensity, which may correspond to restricted diffusion.
PI-RADS(v2.1)-3.
3. No findings of pelvic metastatic disease.

## 2020-03-16 ENCOUNTER — Other Ambulatory Visit: Payer: Self-pay

## 2020-03-16 ENCOUNTER — Ambulatory Visit (INDEPENDENT_AMBULATORY_CARE_PROVIDER_SITE_OTHER): Payer: BC Managed Care – PPO | Admitting: Urology

## 2020-03-16 ENCOUNTER — Encounter: Payer: Self-pay | Admitting: Urology

## 2020-03-16 VITALS — BP 147/77 | HR 64 | Temp 98.5°F | Ht 69.0 in | Wt 225.0 lb

## 2020-03-16 DIAGNOSIS — N401 Enlarged prostate with lower urinary tract symptoms: Secondary | ICD-10-CM | POA: Diagnosis not present

## 2020-03-16 DIAGNOSIS — R3911 Hesitancy of micturition: Secondary | ICD-10-CM

## 2020-03-16 LAB — URINALYSIS, ROUTINE W REFLEX MICROSCOPIC
Bilirubin, UA: NEGATIVE
Glucose, UA: NEGATIVE
Ketones, UA: NEGATIVE
Leukocytes,UA: NEGATIVE
Nitrite, UA: NEGATIVE
Protein,UA: NEGATIVE
RBC, UA: NEGATIVE
Specific Gravity, UA: 1.015 (ref 1.005–1.030)
Urobilinogen, Ur: 0.2 mg/dL (ref 0.2–1.0)
pH, UA: 7 (ref 5.0–7.5)

## 2020-03-16 LAB — MICROSCOPIC EXAMINATION
Epithelial Cells (non renal): NONE SEEN /hpf (ref 0–10)
RBC, Urine: NONE SEEN /hpf (ref 0–2)
Renal Epithel, UA: NONE SEEN /hpf

## 2020-03-16 MED ORDER — TAMSULOSIN HCL 0.4 MG PO CAPS: 0.4000 mg | ORAL_CAPSULE | Freq: Every day | ORAL | 11 refills | Status: DC

## 2020-03-16 NOTE — Progress Notes (Signed)
Urological Symptom Review  Patient is experiencing the following symptoms: Frequent urination Hard to postpone urination Get up at night to urinate Leakage of urine Stream starts and stops Trouble starting stream Have to strain to urinate Weak stream Erection problems (male only)   Review of Systems  Gastrointestinal (upper)  : Negative for upper GI symptoms  Gastrointestinal (lower) : Negative for lower GI symptoms  Constitutional : Negative for symptoms  Skin: Negative for skin symptoms  Eyes: Negative for eye symptoms  Ear/Nose/Throat : Negative for Ear/Nose/Throat symptoms  Hematologic/Lymphatic: Negative for Hematologic/Lymphatic symptoms  Cardiovascular : Negative for cardiovascular symptoms  Respiratory : Negative for respiratory symptoms  Endocrine: Negative for endocrine symptoms  Musculoskeletal: Negative for musculoskeletal symptoms  Neurological: Negative for neurological symptoms  Psychologic: Negative for psychiatric symptoms

## 2020-03-16 NOTE — Progress Notes (Signed)
H&P  Chief Complaint: Elevated PSA  History of Present Illness:   12.28.2021: Pt denies any significant change or improvement in LUTS despite finasteride regimen. He discontinued tamsulosin and reports an inadequate FOS, urinary hesitancy, and inability to empty his bladder completely.  Most recent PSA: 6.5 (6.29.2021)  IPSS Questionnaire (AUA-7): Over the past month.   1)  How often have you had a sensation of not emptying your bladder completely after you finish urinating?  4 - More than half the time  2)  How often have you had to urinate again less than two hours after you finished urinating? 4 - More than half the time  3)  How often have you found you stopped and started again several times when you urinated?  3 - About half the time  4) How difficult have you found it to postpone urination?  4 - More than half the time  5) How often have you had a weak urinary stream?  0 - Not at all  6) How often have you had to push or strain to begin urination?  4 - More than half the time  7) How many times did you most typically get up to urinate from the time you went to bed until the time you got up in the morning?  2 - 2 times  Total score:  0-7 mildly symptomatic   8-19 moderately symptomatic   20-35 severely symptomatic  IPSS: 21 QoL Score: 5  (below copied from [facility] records):  2.11.2014: TRUS/Bx for PSA of 3.83. Prostate volume 56.4 ml. PSAD 0.07. All 12 cores negative. Not seen since then.   7.24.2020: Referred back by Dr Ouida Sills. Most recent PSA 7.7 on 7.8.2020. PSA has been monitored per PCP and has recently spiked. He notes that he has also had weakened stream with "spurting" at the end of urination. He also notes that he has had hesitancy with full bladder but after walking around for a while is able to urinate with a very strong stream. He is interested in medication for his urination.   11.13.2020: Here for fusion TRUS/Bx. Recent MRI prostate--volume 71 ml.PI RADS 3 lesion left  lateral peripheral zone, 1.2 cm. No SV/NV/bony involvement, pelvic LA. No transcapsular involvement. Volume 97 ml. All 4 cores from ROI neg, as well as routine 12 systematic cores.  6.29.2021: IPSS 23, QoL score 3. No recent PSA. LUTS somewhat bothersome.  He is on tamsulosin once a day.  He would be willing to try another medication.  Past Medical History:  Diagnosis Date  . Asthma   . GERD (gastroesophageal reflux disease)   . High blood pressure   . High cholesterol   . Memory disorder 10/03/2016  . Sleep apnea    CPAP    Past Surgical History:  Procedure Laterality Date  . CARPAL TUNNEL RELEASE Bilateral   . CATARACT EXTRACTION W/PHACO Left 01/07/2019   Procedure: CATARACT EXTRACTION PHACO AND INTRAOCULAR LENS PLACEMENT (IOC) LEFT  00:35.0  16.3%  5.70;  Surgeon: Galen Manila, MD;  Location[facility];  Service: Ophthalmology;  Laterality: Left;  sleep apnea  . COLONOSCOPY WITH PROPOFOL N/A 09/29/2019   Procedure: COLONOSCOPY WITH PROPOFOL;  Surgeon: Corbin Ade, MD;  [facility];  Service: Endoscopy;  Laterality: N/A;  1517  . POLYPECTOMY  09/29/2019   Procedure: POLYPECTOMY;  Surgeon: Corbin Ade, MD;  [facility];  Service: Endoscopy;;  . TONSILLECTOMY     Age 27  . WISDOM  TOOTH EXTRACTION     x 4    Home Medications:  Allergies as of 03/16/2020      Reactions   Lisinopril Swelling   lips      Medication List       Accurate as of March 16, 2020  9:23 AM. If you have any questions, ask your nurse or doctor.        Advair Diskus 100-50 MCG/DOSE Aepb Generic drug: Fluticasone-Salmeterol Inhale 1 puff into the lungs in the morning and at bedtime.   azelastine 0.1 % nasal spray Commonly known as: ASTELIN Place 1 spray into both nostrils daily. Use in each nostril as directed   finasteride 5 MG tablet Commonly known as: PROSCAR Take 1 tablet (5 mg total) by mouth daily.   fluticasone 50 MCG/ACT nasal  spray Commonly known as: FLONASE Place 2 sprays into both nostrils daily.   losartan 100 MG tablet Commonly known as: COZAAR Take 100 mg by mouth daily.   montelukast 10 MG tablet Commonly known as: SINGULAIR Take 10 mg by mouth at bedtime.   omeprazole 20 MG capsule Commonly known as: PRILOSEC Take 20 mg by mouth daily.   simvastatin 20 MG tablet Commonly known as: ZOCOR Take 20 mg by mouth daily.   Spiriva HandiHaler 18 MCG inhalation capsule Generic drug: tiotropium Place 18 mcg into inhaler and inhale daily.   Spiriva Respimat 2.5 MCG/ACT Aers Generic drug: Tiotropium Bromide Monohydrate SMARTSIG:2 Puff(s) Via Inhaler Daily   tamsulosin 0.4 MG Caps capsule Commonly known as: FLOMAX Take 0.4 mg by mouth daily.       Allergies:  Allergies  Allergen Reactions  . Lisinopril Swelling    lips    Family History  Problem Relation Age of Onset  . Dementia Mother   . Thyroid disease Mother   . Diabetes Mellitus II Mother   . Cervical cancer Mother   . Glaucoma Mother   . Other Father        low blood pressure  . Memory loss Father   . Colon cancer Neg Hx     Social History:  reports that he quit smoking about 28 years ago. He has never used smokeless tobacco. He reports current alcohol use. He reports that he does not use drugs.  ROS: A complete review of systems was performed.  All systems are negative except for pertinent findings as noted.  Physical Exam:  Vital signs in last 24 hours: There were no vitals taken for this visit. Constitutional:  Alert and oriented, No acute distress Cardiovascular: Regular rate  Respiratory: Normal respiratory effort Neurologic: Grossly intact, no focal deficits Psychiatric: Normal mood and affect  I have reviewed prior pt notes  I have reviewed notes from referring/previous physicians  I have reviewed urinalysis results  I have reviewed prior PSA results    Impression/Assessment:  BPH: Pt stable with  significant LUTS and he will require adding tamsulosin to improve his symptoms  Plan:  1. Pt restarted on tamsulosin, continued on finasteride, and advised regarding TURP as second line treatment.  2. PSA drawn today.  3. F/U in 3 months for OV and symptom recheck.  CC: Dr. Asencion Noble

## 2020-03-17 ENCOUNTER — Telehealth: Payer: Self-pay

## 2020-03-17 LAB — PSA: Prostate Specific Ag, Serum: 7.5 ng/mL — ABNORMAL HIGH (ref 0.0–4.0)

## 2020-03-17 NOTE — Telephone Encounter (Signed)
Pt notified of PSA result. 

## 2020-03-17 NOTE — Telephone Encounter (Signed)
-----   Message from Marcine Matar, MD sent at 03/17/2020  4:06 PM EST ----- 5 patient that PSA is up slightly to 7.5.  We will follow-up at his appointment in 3 months. ----- Message ----- From: Dalia Heading, LPN Sent: [id number]/1470   8:27 AM EST To: Marcine Matar, MD  Pls review.

## 2020-06-15 ENCOUNTER — Ambulatory Visit: Payer: BC Managed Care – PPO | Admitting: Urology

## 2020-10-06 ENCOUNTER — Other Ambulatory Visit: Payer: Self-pay

## 2020-12-20 ENCOUNTER — Other Ambulatory Visit (HOSPITAL_COMMUNITY): Payer: Self-pay | Admitting: Internal Medicine

## 2020-12-20 ENCOUNTER — Ambulatory Visit (HOSPITAL_COMMUNITY)
Admission: RE | Admit: 2020-12-20 | Discharge: 2020-12-20 | Disposition: A | Discharge status: Home / Self Care | Payer: BC Managed Care – PPO | Source: Ambulatory Visit | Attending: Internal Medicine | Admitting: Internal Medicine

## 2020-12-20 ENCOUNTER — Other Ambulatory Visit: Payer: Self-pay

## 2020-12-20 DIAGNOSIS — R0602 Shortness of breath: Secondary | ICD-10-CM | POA: Diagnosis present

## 2021-01-14 ENCOUNTER — Institutional Professional Consult (permissible substitution): Payer: BC Managed Care – PPO | Admitting: Pulmonary Disease

## 2021-02-08 ENCOUNTER — Ambulatory Visit: Payer: BC Managed Care – PPO | Admitting: Pulmonary Disease

## 2021-02-08 ENCOUNTER — Other Ambulatory Visit: Payer: Self-pay

## 2021-02-08 ENCOUNTER — Encounter: Payer: Self-pay | Admitting: Pulmonary Disease

## 2021-02-08 VITALS — BP 122/76 | HR 67 | Temp 97.9°F | Ht 69.0 in | Wt 230.0 lb

## 2021-02-08 DIAGNOSIS — G4733 Obstructive sleep apnea (adult) (pediatric): Secondary | ICD-10-CM | POA: Diagnosis not present

## 2021-02-08 DIAGNOSIS — R0602 Shortness of breath: Secondary | ICD-10-CM

## 2021-02-08 NOTE — Progress Notes (Signed)
Eric Lam    [id number]    [birth year]  Primary Care Physician:Fagan, Carloyn Manner, MD  Referring Physician: Asencion Noble, MD [address] [place],  [place] [address]  Chief complaint:   History of obstructive sleep apnea  HPI:  Patient had [place] in August Taking time to recover  Shortness of breath with exertion He does feel his shortness of breath is getting better  He used to walk up to a couple of miles and up until recently has not been able to do so but noticed that with his usual ambulating around, he is not as short of breath as he felt a few weeks ago  He does have a history of obstructive lung disease for which he is on Spiriva and Advair -Compliant with use  History of obstructive sleep apnea that was diagnosed many years ago Saw Dr. Gwenette Greet in the office here He does recollect that his last pressure change was probably about pressure of 11  Has not followed up with any DME company, has been getting his supplies online Denies any significant issues with his CPAP, feels his CPAP works well Wakes up feeling like he has had a good nights rest  He does take a nap during the day on a regular basis  Outpatient Encounter Medications as of 02/08/2021  Medication Sig   ADVAIR DISKUS 100-50 MCG/DOSE AEPB Inhale 1 puff into the lungs in the morning and at bedtime.    allopurinol (ZYLOPRIM) 300 MG tablet Take 300 mg by mouth daily.   azelastine (ASTELIN) 0.1 % nasal spray Place 1 spray into both nostrils daily. Use in each nostril as directed   finasteride (PROSCAR) 5 MG tablet Take 1 tablet (5 mg total) by mouth daily.   fluticasone (FLONASE) 50 MCG/ACT nasal spray Place 2 sprays into both nostrils daily.    montelukast (SINGULAIR) 10 MG tablet Take 10 mg by mouth at bedtime.    olmesartan (BENICAR) 20 MG tablet Take 20 mg by mouth daily.   omeprazole (PRILOSEC) 20 MG capsule Take 20 mg by mouth daily.    SPIRIVA RESPIMAT 2.5 MCG/ACT AERS SMARTSIG:2  Puff(s) Via Inhaler Daily   tamsulosin (FLOMAX) 0.4 MG CAPS capsule Take 1 capsule (0.4 mg total) by mouth daily after supper.   [DISCONTINUED] simvastatin (ZOCOR) 20 MG tablet Take 20 mg by mouth daily.    [DISCONTINUED] doxycycline (VIBRAMYCIN) 100 MG capsule Take 100 mg by mouth 2 (two) times daily. (Patient not taking: Reported on 02/08/2021)   [DISCONTINUED] losartan (COZAAR) 100 MG tablet Take 100 mg by mouth daily.    [DISCONTINUED] nystatin ointment (MYCOSTATIN) Apply topically. (Patient not taking: Reported on 02/08/2021)   [DISCONTINUED] olmesartan (BENICAR) 40 MG tablet Take 40 mg by mouth daily. (Patient not taking: Reported on 02/08/2021)   [DISCONTINUED] PAXLOVID, 150/100, 10 x 150 MG & 10 x 100MG  TBPK Take by mouth.   [DISCONTINUED] SPIRIVA HANDIHALER 18 MCG inhalation capsule Place 18 mcg into inhaler and inhale daily.    [DISCONTINUED] tamsulosin (FLOMAX) 0.4 MG CAPS capsule Take 0.4 mg by mouth daily.   [DISCONTINUED] triamcinolone ointment (KENALOG) 0.1 % Apply topically. (Patient not taking: Reported on 02/08/2021)   No facility-administered encounter medications on file as of 02/08/2021.    Allergies as of 02/08/2021 - Review Complete 02/08/2021  Allergen Reaction Noted   Lisinopril Swelling     Past Medical History:  Diagnosis Date   Asthma    GERD (gastroesophageal reflux disease)  High blood pressure    High cholesterol    Memory disorder 10/03/2016   Sleep apnea    CPAP    Past Surgical History:  Procedure Laterality Date   CARPAL TUNNEL RELEASE Bilateral    CATARACT EXTRACTION W/PHACO Left 01/07/2019   Procedure: CATARACT EXTRACTION PHACO AND INTRAOCULAR LENS PLACEMENT (IOC) LEFT  00:35.0  16.3%  5.70;  Surgeon: Birder Robson, MD;  Location: [facility];  Service: Ophthalmology;  Laterality: Left;  sleep apnea   COLONOSCOPY WITH PROPOFOL N/A 09/29/2019   Procedure: COLONOSCOPY WITH PROPOFOL;  Surgeon: Daneil Dolin, MD;  [facility];  Service: Endoscopy;  Laterality: N/A;  0815   POLYPECTOMY  09/29/2019   Procedure: POLYPECTOMY;  Surgeon: Daneil Dolin, MD;  [facility];  Service: Endoscopy;;   TONSILLECTOMY     Age 65   WISDOM TOOTH EXTRACTION     x 4    Family History  Problem Relation Age of Onset   Dementia Mother    Thyroid disease Mother    Diabetes Mellitus II Mother    Cervical cancer Mother    Glaucoma Mother    Other Father        low blood pressure   Memory loss Father    Colon cancer Neg Hx     Social History   Socioeconomic History   Marital status: Married    Spouse name: Not on file   Number of children: Not on file   Years of education: Not on file   Highest education level: Not on file  Occupational History   Not on file  Tobacco Use   Smoking status: Former    Types: Cigarettes    Quit date: 03/20/1992    Years since quitting: 28.9   Smokeless tobacco: Never  Vaping Use   Vaping Use: Never used  Substance and Sexual Activity   Alcohol use: Yes    Comment: 1/2 pint per day   Drug use: No   Sexual activity: Not on file  Other Topics Concern   Not on file  Social History Narrative   Lives with wife   Caffeine use: Coffee (30oz per day)   Right handed   Social Determinants of Health   Financial Resource Strain: Not on file  Food Insecurity: Not on file  Transportation Needs: Not on file  Physical Activity: Not on file  Stress: Not on file  Social Connections: Not on file  Intimate Partner Violence: Not on file    Review of Systems  Respiratory:  Positive for apnea and shortness of breath.   Psychiatric/Behavioral:  Positive for sleep disturbance.    Vitals:   02/08/21 0932  BP: 122/76  Pulse: 67  Temp: 97.9 F (36.6 C)  SpO2: 99%     Physical Exam Constitutional:      Appearance: He is obese.  HENT:     Head: Normocephalic.     Nose: No congestion.     Mouth/Throat:     Mouth: Mucous membranes are moist.  Cardiovascular:     Rate  and Rhythm: Normal rate and regular rhythm.     Heart sounds: No murmur heard.   No friction rub.  Pulmonary:     Effort: No respiratory distress.     Breath sounds: No stridor. No rhonchi.  Musculoskeletal:     Cervical back: No rigidity or tenderness.  Neurological:     Mental Status: He is alert.  Psychiatric:  Mood and Affect: Mood normal.   Results of the Epworth flowsheet 02/08/2021  Sitting and reading 1  Watching TV 0  Sitting, inactive in a public place (e.g. a theatre or a meeting) 0  As a passenger in a car for an hour without a break 0  Lying down to rest in the afternoon when circumstances permit 3  Sitting and talking to someone 0  Sitting quietly after a lunch without alcohol 1  In a car, while stopped for a few minutes in traffic 0  Total score 5     Data Reviewed: No machine download available Previous PFT not on record  Recent chest x-ray 12/20/2020 shows no acute infiltrate  Assessment:  Obstructive lung disease -Appears to be stable -On Advair and Spiriva -Prefers to stay on current inhalers  Shortness of breath on exertion Symptoms mostly post-COVID -He notices improvement in symptoms  History of obstructive sleep apnea Compliant with CPAP use -Feels machine continues to work well -He does get his own supplies online  Regarding shortness of breath on exertion Graded exercises, weight loss efforts recommended  Plan/Recommendations: Schedule patient for echocardiogram to assess cardiac function-dyspnea on exertion  Schedule pulmonary function test to assess pulmonary function  Tentative follow-up in 3 months  Encouraged to call with any significant concerns  The only way we may be able to get a download from his machine is if he were to take his machine physically to the DME company to have a download     Sherrilyn Rist MD [facility] 02/08/2021, 9:43 AM  CC: Asencion Noble, MD

## 2021-02-08 NOTE — Patient Instructions (Signed)
Continue using CPAP on a nightly basis  Continue current inhalers including Advair and Spiriva   We will obtain a breathing study on you-will be done on the day you come back in  Echocardiogram to assess cardiac function  Regular exercise Weight loss efforts   Call with significant concerns  Will see you in 3 months

## 2021-02-28 ENCOUNTER — Other Ambulatory Visit (HOSPITAL_COMMUNITY): Payer: BC Managed Care – PPO

## 2021-03-08 ENCOUNTER — Ambulatory Visit: Payer: BC Managed Care – PPO | Admitting: Urology

## 2021-03-29 ENCOUNTER — Ambulatory Visit (HOSPITAL_COMMUNITY): Payer: Medicare PPO | Attending: Cardiology

## 2021-03-29 ENCOUNTER — Other Ambulatory Visit: Payer: Self-pay

## 2021-03-29 DIAGNOSIS — G4733 Obstructive sleep apnea (adult) (pediatric): Secondary | ICD-10-CM | POA: Diagnosis present

## 2021-03-29 DIAGNOSIS — R0602 Shortness of breath: Secondary | ICD-10-CM | POA: Insufficient documentation

## 2021-03-29 LAB — ECHOCARDIOGRAM COMPLETE
Area-P 1/2: 3.53 cm2
S' Lateral: 3.8 cm

## 2021-04-04 NOTE — Progress Notes (Signed)
Echocardiogram overall within normal limits, some impaired relaxation  -Modification of risks for heart disease including diet and exercise

## 2021-05-05 ENCOUNTER — Other Ambulatory Visit: Payer: Self-pay

## 2021-05-05 ENCOUNTER — Ambulatory Visit (INDEPENDENT_AMBULATORY_CARE_PROVIDER_SITE_OTHER): Payer: Medicare PPO | Admitting: Pulmonary Disease

## 2021-05-05 ENCOUNTER — Ambulatory Visit: Payer: Medicare PPO | Admitting: Pulmonary Disease

## 2021-05-05 DIAGNOSIS — G4733 Obstructive sleep apnea (adult) (pediatric): Secondary | ICD-10-CM

## 2021-05-05 DIAGNOSIS — R0602 Shortness of breath: Secondary | ICD-10-CM

## 2021-05-05 LAB — PULMONARY FUNCTION TEST
DL/VA % pred: 96 %
DL/VA: 4.02 ml/min/mmHg/L
DLCO cor % pred: 104 %
DLCO cor: 27.04 ml/min/mmHg
DLCO unc % pred: 104 %
DLCO unc: 27.04 ml/min/mmHg
FEF 25-75 Post: 2.74 L/sec
FEF 25-75 Pre: 2.82 L/sec
FEF2575-%Change-Post: -2 %
FEF2575-%Pred-Post: 104 %
FEF2575-%Pred-Pre: 106 %
FEV1-%Change-Post: 0 %
FEV1-%Pred-Post: 95 %
FEV1-%Pred-Pre: 96 %
FEV1-Post: 3.16 L
FEV1-Pre: 3.19 L
FEV1FVC-%Change-Post: 2 %
FEV1FVC-%Pred-Pre: 102 %
FEV6-%Change-Post: -3 %
FEV6-%Pred-Post: 95 %
FEV6-%Pred-Pre: 99 %
FEV6-Post: 4.02 L
FEV6-Pre: 4.18 L
FEV6FVC-%Pred-Post: 105 %
FEV6FVC-%Pred-Pre: 105 %
FVC-%Change-Post: -3 %
FVC-%Pred-Post: 91 %
FVC-%Pred-Pre: 94 %
FVC-Post: 4.05 L
FVC-Pre: 4.18 L
Post FEV1/FVC ratio: 78 %
Post FEV6/FVC ratio: 100 %
Pre FEV1/FVC ratio: 76 %
Pre FEV6/FVC Ratio: 100 %
RV % pred: 110 %
RV: 2.53 L
TLC % pred: 101 %
TLC: 6.91 L

## 2021-05-05 NOTE — Progress Notes (Signed)
Full PFT performed today. °

## 2021-05-05 NOTE — Patient Instructions (Signed)
Full PFT performed today. °

## 2021-05-06 ENCOUNTER — Telehealth: Payer: Self-pay | Admitting: Nurse Practitioner

## 2021-05-06 NOTE — Telephone Encounter (Signed)
I called the patient and left a message to verify that he was using his CPAP and if so to bring his SD card. Waiting on call back.

## 2021-05-06 NOTE — Telephone Encounter (Signed)
Patient uses [facility] for CPAP machine. Patient phone number is [phone].

## 2021-05-09 ENCOUNTER — Ambulatory Visit: Payer: Medicare PPO | Admitting: Nurse Practitioner

## 2021-05-09 ENCOUNTER — Other Ambulatory Visit: Payer: Self-pay

## 2021-05-09 ENCOUNTER — Encounter: Payer: Self-pay | Admitting: Nurse Practitioner

## 2021-05-09 VITALS — BP 154/70 | HR 69 | Temp 98.5°F | Ht 68.0 in | Wt 231.0 lb

## 2021-05-09 DIAGNOSIS — J449 Chronic obstructive pulmonary disease, unspecified: Secondary | ICD-10-CM

## 2021-05-09 DIAGNOSIS — J3089 Other allergic rhinitis: Secondary | ICD-10-CM | POA: Diagnosis not present

## 2021-05-09 DIAGNOSIS — G4733 Obstructive sleep apnea (adult) (pediatric): Secondary | ICD-10-CM

## 2021-05-09 DIAGNOSIS — J302 Other seasonal allergic rhinitis: Secondary | ICD-10-CM

## 2021-05-09 MED ORDER — LORATADINE 10 MG PO TABS: 10.0000 mg | ORAL_TABLET | Freq: Every day | ORAL | 5 refills | Status: DC

## 2021-05-09 NOTE — Assessment & Plan Note (Signed)
Continue current regimen. Add Claritin daily as needed for allergies.

## 2021-05-09 NOTE — Progress Notes (Signed)
@Patient  ID: Eric Foil., male    DOB: 1955/05/04, 66 y.o.   MRN: [id number]  Chief Complaint  Patient presents with   Follow-up    Patient is here for PFT results.     Referring provider: Asencion Noble, MD  HPI: 66 year old male, former smoker followed for OSA on CPAP and history of asthma.  Past medical history significant for hypertension, allergic rhinitis, HLD.  TEST/EVENTS:  03/29/2021 echocardiogram: EF 60 to 65%.  G1 DD.  Otherwise normal exam. 05/05/2021 PFTs: FVC 4.18 (94), FEV1 3.19 (96), ratio 78, TLC 101%, DLCOunc 104%.  No BD. Overall normal spirometry  02/08/2021: OV with Dr. Ander Slade.  Previously with COVID infection in August.  Still experiencing some shortness of breath with exertion however felt as though it was slowly improving.  Does have a history of obstructive lung disease which he was on Spiriva and Advair for -continued at this visit.  History of OSA; patient believed pressure was around 11 cmH2O.  Unable to obtain download.  Does not follow with any DME company and get supplies online.  Epworth 5.  PFTs ordered.  Echocardiogram ordered for further evaluation of DOE, which was unremarkable.  05/09/2021: Today - follow up Patient presents today for follow up after PFTs, which showed overall normal spirometry. He did use both of his inhalers the morning of, which we discussed can skew results. He reports his breathing as stable and doing well on his current regimen. He denies any excessive SOB with exertion and is able to complete daily tasks without difficulties. He continues on his CPAP nightly and denies any issues. He does not go through a DME for his supplies and does not have a SD card in his machine. He denies any excessive daytime fatigue, drowsy driving, morning headaches or narcolepsy. He does have some allergy symptoms which are somewhat controlled with his current regimen. Overall, he feels well and offers no further complaints.   Allergies  Allergen  Reactions   Lisinopril Swelling    lips    Immunization History  Administered Date(s) Administered   Influenza Whole 12/21/2008   Influenza,inj,Quad PF,6+ Mos 12/20/2020   Influenza,inj,quad, With Preservative 12/26/2018   Moderna Sars-Covid-2 Vaccination 04/30/2019, 05/28/2019, 02/16/2020, 06/22/2020   Tdap 11/07/2018, 12/23/2018    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   Sleep apnea    CPAP    Tobacco History: Social History   Tobacco Use  Smoking Status Former   Types: Cigarettes   Quit date: 03/20/1992   Years since quitting: 29.1  Smokeless Tobacco Never   Counseling given: Not Answered   Outpatient Medications Prior to Visit  Medication Sig Dispense Refill   ADVAIR DISKUS 100-50 MCG/DOSE AEPB Inhale 1 puff into the lungs in the morning and at bedtime.      allopurinol (ZYLOPRIM) 300 MG tablet Take 300 mg by mouth daily.     azelastine (ASTELIN) 0.1 % nasal spray Place 1 spray into both nostrils daily. Use in each nostril as directed     finasteride (PROSCAR) 5 MG tablet Take 1 tablet (5 mg total) by mouth daily. 90 tablet 3   fluticasone (FLONASE) 50 MCG/ACT nasal spray Place 2 sprays into both nostrils daily.      montelukast (SINGULAIR) 10 MG tablet Take 10 mg by mouth at bedtime.      olmesartan (  BENICAR) 20 MG tablet Take 40 mg by mouth daily.     omeprazole (PRILOSEC) 20 MG capsule Take 20 mg by mouth daily.      SPIRIVA RESPIMAT 2.5 MCG/ACT AERS SMARTSIG:2 Puff(s) Via Inhaler Daily     tamsulosin (FLOMAX) 0.4 MG CAPS capsule Take 1 capsule (0.4 mg total) by mouth daily after supper. 90 capsule 11   No facility-administered medications prior to visit.     Review of Systems:   Constitutional: No weight loss or gain, night sweats, fevers, chills, fatigue, or lassitude. HEENT: No headaches, difficulty swallowing, tooth/dental  problems, or sore throat. No itching, ear ache. +occasional nasal congestion/drainage, sneezing CV:  No chest pain, orthopnea, PND, swelling in lower extremities, anasarca, dizziness, palpitations, syncope Resp: No shortness of breath with exertion or at rest. No excess mucus or change in color of mucus. No productive or non-productive. No hemoptysis. No wheezing.  No chest wall deformity GI:  No heartburn, indigestion, abdominal pain, nausea, vomiting, diarrhea, change in bowel habits, loss of appetite, bloody stools.  GU: No dysuria, change in color of urine, urgency or frequency.  No flank pain, no hematuria  Skin: No rash, lesions, ulcerations MSK:  No joint pain or swelling.  No decreased range of motion.  No back pain. Neuro: No dizziness or lightheadedness.  Psych: No depression or anxiety. Mood stable.     Physical Exam:  BP (!) 154/70 (BP Location: Left Arm, Patient Position: Sitting, Cuff Size: Normal)    Pulse 69    Temp 98.5 F (36.9 C) (Oral)    Ht 5\' 8"  (1.727 m)    Wt 231 lb (104.8 kg)    SpO2 97%    BMI 35.12 kg/m   GEN: Pleasant, interactive, well-appearing; obese; in no acute distress. HEENT:  Normocephalic and atraumatic. EACs patent bilaterally. TM pearly gray with present light reflex bilaterally. PERRLA. Sclera white. Nasal turbinates pink, moist and patent bilaterally. No rhinorrhea present. Oropharynx pink and moist, without exudate or edema. No lesions, ulcerations, or postnasal drip.  NECK:  Supple w/ fair ROM. No JVD present. Normal carotid impulses w/o bruits. Thyroid symmetrical with no goiter or nodules palpated. No lymphadenopathy.   CV: RRR, no m/r/g, no peripheral edema. Pulses intact, +2 bilaterally. No cyanosis, pallor or clubbing. PULMONARY:  Unlabored, regular breathing. Clear bilaterally A&P w/o wheezes/rales/rhonchi. No accessory muscle use. No dullness to percussion. GI: BS present and normoactive. Soft, non-tender to palpation. No organomegaly or masses  detected. No CVA tenderness. MSK: No erythema, warmth or tenderness. Cap refil <2 sec all extrem. No deformities or joint swelling noted.  Neuro: A/Ox3. No focal deficits noted.   Skin: Warm, no lesions or rashe Psych: Normal affect and behavior. Judgement and thought content appropriate.     Lab Results:  CBC No results found for: WBC, RBC, HGB, HCT, PLT, MCV, MCH, MCHC, RDW, LYMPHSABS, MONOABS, EOSABS, BASOSABS  BMET    Component Value Date/Time   CREATININE 0.80 12/24/2018 0812    BNP No results found for: BNP   Imaging:  No results found.    PFT Results Latest Ref Rng & Units 05/05/2021  FVC-Pre L 4.18  FVC-Predicted Pre % 94  FVC-Post L 4.05  FVC-Predicted Post % 91  Pre FEV1/FVC % % 76  Post FEV1/FCV % % 78  FEV1-Pre L 3.19  FEV1-Predicted Pre % 96  FEV1-Post L 3.16  DLCO uncorrected ml/min/mmHg 27.04  DLCO UNC% % 104  DLCO corrected ml/min/mmHg 27.04  DLCO COR %Predicted % 104  DLVA Predicted % 96  TLC L 6.91  TLC % Predicted % 101  RV % Predicted % 110    No results found for: NITRICOXIDE      Assessment & Plan:   COPD (chronic obstructive pulmonary disease) (HCC) Breathing stable on triple therapy regimen. Hx of obstructive lung disease with asthma and tobacco abuse. Recent PFTs overall normal, although he did use Spiriva and Advair morning of exam. Will continue current regimen, as pt wishes.   Patient Instructions  Continue Advair 1 puff Twice daily. Brush tongue and rinse mouth afterwards Continue Spiriva 2 puffs daily  Continue Singulair 10 mg At bedtime  Continue flonase 2 puffs each nostril daily  Continue astelin nasal spray 1 spray each nostril daily  Continue Prilosec 20 mg daily  Claritin 10 mg daily for allergies   Continue to use CPAP every night, minimum of 4-6 hours a night.  Change equipment every 30 days or as directed by DME. Wash your tubing with warm soap and water daily, hang to dry. Wash humidifier portion weekly.   Maintain clean equipment, as directed by home health agency.  Be aware of reduced alertness and do not drive or operate heavy machinery if experiencing this or drowsiness.  Exercise encouraged, as tolerated. Notify if persistent daytime sleepiness occurs even with consistent use of CPAP. Obtain SD card for machine and bring with you to your next visit.   Follow up in 6 months with Dr. Ander Slade or Alanson Aly. If symptoms do not improve or worsen, please contact office for sooner follow up or seek emergency care.    Allergic rhinitis Continue current regimen. Add Claritin daily as needed for allergies.   OBSTRUCTIVE SLEEP APNEA Unable to obtain download today. Reports wearing nightly without any issues. Advised to obtain SD card for machine and bring with him to next visit.    Clayton Bibles, NP 05/09/2021  Pt aware and understands NP's role.

## 2021-05-09 NOTE — Assessment & Plan Note (Signed)
Unable to obtain download today. Reports wearing nightly without any issues. Advised to obtain SD card for machine and bring with him to next visit.

## 2021-05-09 NOTE — Patient Instructions (Addendum)
Continue Advair 1 puff Twice daily. Brush tongue and rinse mouth afterwards Continue Spiriva 2 puffs daily  Continue Singulair 10 mg At bedtime  Continue flonase 2 puffs each nostril daily  Continue astelin nasal spray 1 spray each nostril daily  Continue Prilosec 20 mg daily  Claritin 10 mg daily for allergies   Continue to use CPAP every night, minimum of 4-6 hours a night.  Change equipment every 30 days or as directed by DME. Wash your tubing with warm soap and water daily, hang to dry. Wash humidifier portion weekly.  Maintain clean equipment, as directed by home health agency.  Be aware of reduced alertness and do not drive or operate heavy machinery if experiencing this or drowsiness.  Exercise encouraged, as tolerated. Notify if persistent daytime sleepiness occurs even with consistent use of CPAP. Obtain SD card for machine and bring with you to your next visit.   Follow up in 6 months with Dr. Ander Slade or Alanson Aly. If symptoms do not improve or worsen, please contact office for sooner follow up or seek emergency care.

## 2021-05-09 NOTE — Assessment & Plan Note (Signed)
Breathing stable on triple therapy regimen. Hx of obstructive lung disease with asthma and tobacco abuse. Recent PFTs overall normal, although he did use Spiriva and Advair morning of exam. Will continue current regimen, as pt wishes.   Patient Instructions  Continue Advair 1 puff Twice daily. Brush tongue and rinse mouth afterwards Continue Spiriva 2 puffs daily  Continue Singulair 10 mg At bedtime  Continue flonase 2 puffs each nostril daily  Continue astelin nasal spray 1 spray each nostril daily  Continue Prilosec 20 mg daily  Claritin 10 mg daily for allergies   Continue to use CPAP every night, minimum of 4-6 hours a night.  Change equipment every 30 days or as directed by DME. Wash your tubing with warm soap and water daily, hang to dry. Wash humidifier portion weekly.  Maintain clean equipment, as directed by home health agency.  Be aware of reduced alertness and do not drive or operate heavy machinery if experiencing this or drowsiness.  Exercise encouraged, as tolerated. Notify if persistent daytime sleepiness occurs even with consistent use of CPAP. Obtain SD card for machine and bring with you to your next visit.   Follow up in 6 months with Dr. Ander Slade or Alanson Aly. If symptoms do not improve or worsen, please contact office for sooner follow up or seek emergency care.

## 2021-05-26 NOTE — Telephone Encounter (Signed)
Noted and updated in the chart.  ?

## 2021-08-08 ENCOUNTER — Other Ambulatory Visit: Payer: Self-pay | Admitting: Urology

## 2021-08-29 ENCOUNTER — Encounter (HOSPITAL_BASED_OUTPATIENT_CLINIC_OR_DEPARTMENT_OTHER): Payer: Self-pay

## 2021-08-29 ENCOUNTER — Ambulatory Visit (HOSPITAL_BASED_OUTPATIENT_CLINIC_OR_DEPARTMENT_OTHER): Admit: 2021-08-29 | Payer: Medicare PPO | Admitting: Urology

## 2021-08-29 SURGERY — TURP (TRANSURETHRAL RESECTION OF PROSTATE)
Anesthesia: General

## 2021-09-30 DIAGNOSIS — Z79899 Other long term (current) drug therapy: Secondary | ICD-10-CM | POA: Diagnosis not present

## 2021-09-30 DIAGNOSIS — E785 Hyperlipidemia, unspecified: Secondary | ICD-10-CM | POA: Diagnosis not present

## 2021-09-30 DIAGNOSIS — I10 Essential (primary) hypertension: Secondary | ICD-10-CM | POA: Diagnosis not present

## 2021-09-30 DIAGNOSIS — G4733 Obstructive sleep apnea (adult) (pediatric): Secondary | ICD-10-CM | POA: Diagnosis not present

## 2021-09-30 DIAGNOSIS — K219 Gastro-esophageal reflux disease without esophagitis: Secondary | ICD-10-CM | POA: Diagnosis not present

## 2021-09-30 DIAGNOSIS — J45909 Unspecified asthma, uncomplicated: Secondary | ICD-10-CM | POA: Diagnosis not present

## 2021-09-30 DIAGNOSIS — N400 Enlarged prostate without lower urinary tract symptoms: Secondary | ICD-10-CM | POA: Diagnosis not present

## 2021-09-30 DIAGNOSIS — R7303 Prediabetes: Secondary | ICD-10-CM | POA: Diagnosis not present

## 2021-09-30 DIAGNOSIS — M109 Gout, unspecified: Secondary | ICD-10-CM | POA: Diagnosis not present

## 2021-10-06 DIAGNOSIS — M25561 Pain in right knee: Secondary | ICD-10-CM | POA: Diagnosis not present

## 2021-10-07 DIAGNOSIS — Z6833 Body mass index (BMI) 33.0-33.9, adult: Secondary | ICD-10-CM | POA: Diagnosis not present

## 2021-10-07 DIAGNOSIS — I493 Ventricular premature depolarization: Secondary | ICD-10-CM | POA: Diagnosis not present

## 2021-10-07 DIAGNOSIS — E785 Hyperlipidemia, unspecified: Secondary | ICD-10-CM | POA: Diagnosis not present

## 2021-10-07 DIAGNOSIS — I10 Essential (primary) hypertension: Secondary | ICD-10-CM | POA: Diagnosis not present

## 2021-11-02 DIAGNOSIS — N400 Enlarged prostate without lower urinary tract symptoms: Secondary | ICD-10-CM | POA: Diagnosis not present

## 2021-11-02 DIAGNOSIS — Z79899 Other long term (current) drug therapy: Secondary | ICD-10-CM | POA: Diagnosis not present

## 2021-11-03 DIAGNOSIS — R338 Other retention of urine: Secondary | ICD-10-CM | POA: Diagnosis not present

## 2021-11-03 DIAGNOSIS — N401 Enlarged prostate with lower urinary tract symptoms: Secondary | ICD-10-CM | POA: Diagnosis not present

## 2021-11-08 DIAGNOSIS — M1990 Unspecified osteoarthritis, unspecified site: Secondary | ICD-10-CM | POA: Diagnosis not present

## 2021-11-08 DIAGNOSIS — I10 Essential (primary) hypertension: Secondary | ICD-10-CM | POA: Diagnosis not present

## 2021-11-17 DIAGNOSIS — I10 Essential (primary) hypertension: Secondary | ICD-10-CM | POA: Diagnosis not present

## 2021-11-17 DIAGNOSIS — N138 Other obstructive and reflux uropathy: Secondary | ICD-10-CM | POA: Diagnosis not present

## 2021-11-17 DIAGNOSIS — E785 Hyperlipidemia, unspecified: Secondary | ICD-10-CM | POA: Diagnosis not present

## 2021-11-17 DIAGNOSIS — Z01818 Encounter for other preprocedural examination: Secondary | ICD-10-CM | POA: Diagnosis not present

## 2021-11-17 DIAGNOSIS — J449 Chronic obstructive pulmonary disease, unspecified: Secondary | ICD-10-CM | POA: Diagnosis not present

## 2021-11-17 DIAGNOSIS — K219 Gastro-esophageal reflux disease without esophagitis: Secondary | ICD-10-CM | POA: Diagnosis not present

## 2021-11-17 DIAGNOSIS — M109 Gout, unspecified: Secondary | ICD-10-CM | POA: Diagnosis not present

## 2021-11-17 DIAGNOSIS — N401 Enlarged prostate with lower urinary tract symptoms: Secondary | ICD-10-CM | POA: Diagnosis not present

## 2021-11-22 DIAGNOSIS — M25561 Pain in right knee: Secondary | ICD-10-CM | POA: Diagnosis not present

## 2021-12-15 DIAGNOSIS — N400 Enlarged prostate without lower urinary tract symptoms: Secondary | ICD-10-CM | POA: Diagnosis not present

## 2021-12-19 DIAGNOSIS — R319 Hematuria, unspecified: Secondary | ICD-10-CM | POA: Diagnosis not present

## 2021-12-19 DIAGNOSIS — N390 Urinary tract infection, site not specified: Secondary | ICD-10-CM | POA: Diagnosis not present

## 2021-12-20 ENCOUNTER — Ambulatory Visit: Payer: Medicare PPO | Admitting: Pulmonary Disease

## 2021-12-20 ENCOUNTER — Encounter: Payer: Self-pay | Admitting: Pulmonary Disease

## 2021-12-20 VITALS — BP 120/80 | HR 61 | Ht 68.0 in | Wt 224.0 lb

## 2021-12-20 DIAGNOSIS — G4733 Obstructive sleep apnea (adult) (pediatric): Secondary | ICD-10-CM

## 2021-12-20 NOTE — Progress Notes (Signed)
Eric Lam    [id number]    1956/01/23  Primary Care Physician:Fagan, Carloyn Manner, MD  Referring Physician: Asencion Noble, MD [address] [place],  [place] [address]  Chief complaint:   History of obstructive sleep apnea  HPI:  He feels his machine is not working as well as previously Has used CPAP for over 30 years  Does not recollect the last time he had an updated machine Feels the machine is not working as well  Sleeps with CPAP every night  Usually goes to bed about 11, wake up time about 6 AM Wakes up feeling rejuvenated  His weight has remained stable  Past history of smoking Last time he was seen in the office he was just following having a COVID infection  Breathing is steady  He is on Spiriva and Advair long-term -Compliant with use  History of obstructive sleep apnea that was diagnosed many years ago Saw Dr. Gwenette Greet in the office here He does recollect that his last pressure change was probably about pressure of 11  Has not followed up with any DME company, has been getting his supplies online Denies any significant issues with his CPAP, feels his CPAP works well  He does take a nap during the day on a regular basis  Outpatient Encounter Medications as of 12/20/2021  Medication Sig   ADVAIR DISKUS 100-50 MCG/DOSE AEPB Inhale 1 puff into the lungs in the morning and at bedtime.    allopurinol (ZYLOPRIM) 300 MG tablet Take 300 mg by mouth daily.   azelastine (ASTELIN) 0.1 % nasal spray Place 1 spray into both nostrils daily. Use in each nostril as directed   finasteride (PROSCAR) 5 MG tablet Take 1 tablet (5 mg total) by mouth daily.   fluticasone (FLONASE) 50 MCG/ACT nasal spray Place 2 sprays into both nostrils daily.    loratadine (CLARITIN) 10 MG tablet Take 1 tablet (10 mg total) by mouth daily.   montelukast (SINGULAIR) 10 MG tablet Take 10 mg by mouth at bedtime.    olmesartan (BENICAR) 20 MG tablet Take 40 mg by mouth daily.    omeprazole (PRILOSEC) 20 MG capsule Take 20 mg by mouth daily.    SPIRIVA RESPIMAT 2.5 MCG/ACT AERS SMARTSIG:2 Puff(s) Via Inhaler Daily   tamsulosin (FLOMAX) 0.4 MG CAPS capsule Take 1 capsule (0.4 mg total) by mouth daily after supper.   [DISCONTINUED] simvastatin (ZOCOR) 20 MG tablet Take 20 mg by mouth daily.    No facility-administered encounter medications on file as of 12/20/2021.    Allergies as of 12/20/2021 - Review Complete 05/09/2021  Allergen Reaction Noted   Lisinopril Swelling     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   Sleep apnea    CPAP    Past Surgical History:  Procedure Laterality Date   CARPAL TUNNEL RELEASE Bilateral    CATARACT EXTRACTION W/PHACO Left 01/07/2019   Procedure: CATARACT EXTRACTION PHACO AND INTRAOCULAR LENS PLACEMENT (IOC) LEFT  00:35.0  16.3%  5.70;  Surgeon: Birder Robson, MD;  Location: [facility];  Service: Ophthalmology;  Laterality: Left;  sleep apnea   COLONOSCOPY WITH PROPOFOL N/A 09/29/2019   Procedure: COLONOSCOPY WITH PROPOFOL;  Surgeon: Daneil Dolin, MD;  [facility];  Service: Endoscopy;  Laterality: N/A;  0998   POLYPECTOMY  09/29/2019   Procedure: POLYPECTOMY;  Surgeon: Daneil Dolin, MD;  [facility];  Service: Endoscopy;;   TONSILLECTOMY     Age 66   WISDOM TOOTH EXTRACTION     x 4    Family History  Problem Relation Age of Onset   Dementia Mother    Thyroid disease Mother    Diabetes Mellitus II Mother    Cervical cancer Mother    Glaucoma Mother    Other Father        low blood pressure   Memory loss Father    Colon cancer Neg Hx     Social History   Socioeconomic History   Marital status: Married    Spouse name: Not on file   Number of children: Not on file   Years of education: Not on file   Highest education level: Not on file  Occupational  History   Not on file  Tobacco Use   Smoking status: Former    Types: Cigarettes    Quit date: 03/20/1992    Years since quitting: 29.7   Smokeless tobacco: Never  Vaping Use   Vaping Use: Never used  Substance and Sexual Activity   Alcohol use: Yes    Comment: 1/2 pint per day   Drug use: No   Sexual activity: Not on file  Other Topics Concern   Not on file  Social History Narrative   Lives with wife   Caffeine use: Coffee (30oz per day)   Right handed   Social Determinants of Health   Financial Resource Strain: Not on file  Food Insecurity: Not on file  Transportation Needs: Not on file  Physical Activity: Not on file  Stress: Not on file  Social Connections: Not on file  Intimate Partner Violence: Not on file    Review of Systems  Respiratory:  Positive for apnea and shortness of breath.   Psychiatric/Behavioral:  Positive for sleep disturbance.     There were no vitals filed for this visit.    Physical Exam Constitutional:      Appearance: He is obese.  HENT:     Head: Normocephalic.     Nose: No congestion.     Mouth/Throat:     Mouth: Mucous membranes are moist.  Cardiovascular:     Rate and Rhythm: Normal rate and regular rhythm.     Heart sounds: No murmur heard.    No friction rub.  Pulmonary:     Effort: No respiratory distress.     Breath sounds: No stridor. No rhonchi.  Musculoskeletal:     Cervical back: No rigidity or tenderness.  Neurological:     Mental Status: He is alert.  Psychiatric:        Mood and Affect: Mood normal.       02/08/2021    9:00 AM  Results of the Epworth flowsheet  Sitting and reading 1  Watching TV 0  Sitting, inactive in a public place (e.g. a theatre or a meeting) 0  As a passenger in a car for an hour without a break 0  Lying down to rest in the afternoon when circumstances permit 3  Sitting and talking to someone 0  Sitting quietly after a lunch without alcohol 1  In a car, while stopped for a few  minutes in traffic 0  Total score 5     Data Reviewed: No machine download available Previous PFT not on record  Recent chest x-ray 12/20/2020 shows no  acute infiltrate  Most recent PFT from 2023 reviewed with the patient showing no obstruction, no significant bronchodilator response, no restriction with normal diffusing capacity  Assessment:  Obstructive lung disease -Appears to be stable -On Advair and Spiriva -Encouraged to wean off Advair  Obstructive sleep apnea -Current machine is very dated -Last sleep study is at least over 20 years ago -We will schedule him for home sleep study to confirm presence of significant obstructive sleep apnea -Needs a machine upgrade  Class I obesity -Encourage weight loss efforts  Plan/Recommendations: We will schedule patient for a home sleep study  Encouraged to wean off Advair  Tentative follow-up in 3 to 4 months  Encouraged to call with significant concerns   Sherrilyn Rist MD [facility] 12/20/2021, 1:14 PM  CC: Asencion Noble, MD

## 2021-12-20 NOTE — Patient Instructions (Signed)
Schedule for home sleep study  Try and get off Advair as safely as you can You can go to 1 puff a day and if your symptoms remain stable, you can try and get off it completely If your symptoms were to get worse just get back to using it regularly  I will see you back in about 3 months  Continue using current machine  Call us with significant concerns   Because your last study was so many years ago, we need to confirm that you still have significant sleep apnea that requires CPAP treatment

## 2022-01-03 DIAGNOSIS — J449 Chronic obstructive pulmonary disease, unspecified: Secondary | ICD-10-CM | POA: Diagnosis not present

## 2022-01-03 DIAGNOSIS — R338 Other retention of urine: Secondary | ICD-10-CM | POA: Diagnosis not present

## 2022-01-03 DIAGNOSIS — M109 Gout, unspecified: Secondary | ICD-10-CM | POA: Diagnosis not present

## 2022-01-03 DIAGNOSIS — E785 Hyperlipidemia, unspecified: Secondary | ICD-10-CM | POA: Diagnosis not present

## 2022-01-03 DIAGNOSIS — C61 Malignant neoplasm of prostate: Secondary | ICD-10-CM | POA: Diagnosis not present

## 2022-01-03 DIAGNOSIS — Z7951 Long term (current) use of inhaled steroids: Secondary | ICD-10-CM | POA: Diagnosis not present

## 2022-01-03 DIAGNOSIS — Z79899 Other long term (current) drug therapy: Secondary | ICD-10-CM | POA: Diagnosis not present

## 2022-01-03 DIAGNOSIS — I10 Essential (primary) hypertension: Secondary | ICD-10-CM | POA: Diagnosis not present

## 2022-01-03 DIAGNOSIS — N400 Enlarged prostate without lower urinary tract symptoms: Secondary | ICD-10-CM | POA: Diagnosis not present

## 2022-01-03 DIAGNOSIS — N401 Enlarged prostate with lower urinary tract symptoms: Secondary | ICD-10-CM | POA: Diagnosis not present

## 2022-01-03 DIAGNOSIS — Z87891 Personal history of nicotine dependence: Secondary | ICD-10-CM | POA: Diagnosis not present

## 2022-01-04 DIAGNOSIS — Z978 Presence of other specified devices: Secondary | ICD-10-CM | POA: Diagnosis not present

## 2022-01-04 DIAGNOSIS — Z09 Encounter for follow-up examination after completed treatment for conditions other than malignant neoplasm: Secondary | ICD-10-CM | POA: Diagnosis not present

## 2022-02-06 DIAGNOSIS — Z961 Presence of intraocular lens: Secondary | ICD-10-CM | POA: Diagnosis not present

## 2022-02-13 DIAGNOSIS — H26491 Other secondary cataract, right eye: Secondary | ICD-10-CM | POA: Diagnosis not present

## 2022-02-14 DIAGNOSIS — I10 Essential (primary) hypertension: Secondary | ICD-10-CM | POA: Diagnosis not present

## 2022-02-14 DIAGNOSIS — G4733 Obstructive sleep apnea (adult) (pediatric): Secondary | ICD-10-CM | POA: Diagnosis not present

## 2022-02-14 DIAGNOSIS — N400 Enlarged prostate without lower urinary tract symptoms: Secondary | ICD-10-CM | POA: Diagnosis not present

## 2022-02-14 DIAGNOSIS — Z8042 Family history of malignant neoplasm of prostate: Secondary | ICD-10-CM | POA: Diagnosis not present

## 2022-02-16 DIAGNOSIS — M25561 Pain in right knee: Secondary | ICD-10-CM | POA: Diagnosis not present

## 2022-02-18 DIAGNOSIS — H10501 Unspecified blepharoconjunctivitis, right eye: Secondary | ICD-10-CM | POA: Diagnosis not present

## 2022-02-28 DIAGNOSIS — S83231D Complex tear of medial meniscus, current injury, right knee, subsequent encounter: Secondary | ICD-10-CM | POA: Diagnosis not present

## 2022-02-28 DIAGNOSIS — M84351D Stress fracture, right femur, subsequent encounter for fracture with routine healing: Secondary | ICD-10-CM | POA: Diagnosis not present

## 2022-02-28 DIAGNOSIS — M25561 Pain in right knee: Secondary | ICD-10-CM | POA: Diagnosis not present

## 2022-02-28 DIAGNOSIS — S83271D Complex tear of lateral meniscus, current injury, right knee, subsequent encounter: Secondary | ICD-10-CM | POA: Diagnosis not present

## 2022-03-23 ENCOUNTER — Ambulatory Visit: Payer: Medicare PPO

## 2022-03-23 DIAGNOSIS — G4733 Obstructive sleep apnea (adult) (pediatric): Secondary | ICD-10-CM

## 2022-04-01 ENCOUNTER — Telehealth: Payer: Self-pay | Admitting: Pulmonary Disease

## 2022-04-01 DIAGNOSIS — G4733 Obstructive sleep apnea (adult) (pediatric): Secondary | ICD-10-CM | POA: Diagnosis not present

## 2022-04-01 NOTE — Telephone Encounter (Signed)
Call patient  Sleep study result  Date of study: 03/23/2022  Impression: Severe obstructive sleep apnea Moderate oxygen desaturations  Recommendation: DME referral  Recommend CPAP therapy for severe obstructive sleep apnea  Auto titrating CPAP with pressure settings of 5- 20 will be appropriate  Encourage weight loss measures  Follow-up in the office 4 to 6 weeks following initiation of treatment   Note: Patient already on CPAP, needs new device

## 2022-04-03 NOTE — Telephone Encounter (Signed)
Called pt and he was unavailable  LMTCB

## 2022-04-04 DIAGNOSIS — C61 Malignant neoplasm of prostate: Secondary | ICD-10-CM | POA: Diagnosis not present

## 2022-04-06 ENCOUNTER — Other Ambulatory Visit: Payer: Self-pay

## 2022-04-06 DIAGNOSIS — G4733 Obstructive sleep apnea (adult) (pediatric): Secondary | ICD-10-CM

## 2022-04-06 NOTE — Telephone Encounter (Signed)
Spoke with patient regarding sleep study result's.  Date of study: 03/23/2022   Impression: Severe obstructive sleep apnea Moderate oxygen desaturations   Recommendation: DME referral   Recommend CPAP therapy for severe obstructive sleep apnea   Auto titrating CPAP with pressure settings of 5- 20 will be appropriate   Encourage weight loss measures   Follow-up in the office 4 to 6 weeks following initiation of treatment     Note: Patient already on CPAP, needs new device  Patient's voice was understanding.Nothing else further needed.

## 2022-04-10 ENCOUNTER — Ambulatory Visit: Payer: Medicare PPO | Admitting: Nurse Practitioner

## 2022-04-12 DIAGNOSIS — C61 Malignant neoplasm of prostate: Secondary | ICD-10-CM | POA: Diagnosis not present

## 2022-04-12 DIAGNOSIS — N400 Enlarged prostate without lower urinary tract symptoms: Secondary | ICD-10-CM | POA: Diagnosis not present

## 2022-04-21 DIAGNOSIS — G4733 Obstructive sleep apnea (adult) (pediatric): Secondary | ICD-10-CM | POA: Diagnosis not present

## 2022-05-20 DIAGNOSIS — G4733 Obstructive sleep apnea (adult) (pediatric): Secondary | ICD-10-CM | POA: Diagnosis not present

## 2022-05-22 ENCOUNTER — Encounter: Payer: Self-pay | Admitting: Pulmonary Disease

## 2022-05-22 ENCOUNTER — Ambulatory Visit: Payer: Medicare PPO | Admitting: Pulmonary Disease

## 2022-05-22 VITALS — BP 140/76 | HR 63 | Ht 68.0 in | Wt 222.0 lb

## 2022-05-22 DIAGNOSIS — G4733 Obstructive sleep apnea (adult) (pediatric): Secondary | ICD-10-CM

## 2022-05-22 NOTE — Patient Instructions (Signed)
DME referral to fixed CPAP at 15 cm of water  Continue using CPAP on a nightly basis  Continue using your inhalers  Call us with significant concerns  Graded exercises as tolerated  I will see you 6 months from here

## 2022-05-22 NOTE — Progress Notes (Signed)
Eric Lam    [id number]    [birth year]  Primary Care Physician:Fagan, Carloyn Manner, MD  Referring Physician: Asencion Noble, MD [address] [place],  [place] [address]  Chief complaint:   History of obstructive sleep apnea  HPI:  Had a dated machine and has since received a new machine  He is currently on auto CPAP  He notices pressure fluctuations when he is trying to take a nap during the day and he wonders whether he can be placed on a fixed CPAP  He is sleeping well, functioning well Waking up feeling like he is at a good nights rest  Has had obstructive sleep apnea for over 30 years  Usually goes to bed about 11, wake up time about 6 AM Wakes up feeling rejuvenated  Weight has remained stable  Past history of smoking  Breathing is steady  He is on Spiriva and Advair long-term -Compliant with use -Did decrease Advair use and wondering whether it may be able to get off Advair and continue with Spiriva  History of obstructive sleep apnea that was diagnosed many years ago Saw Dr. Gwenette Greet in the office here He does recollect that his last pressure change was probably about pressure of 11  Has not followed up with any DME company, has been getting his supplies online Denies any significant issues with his CPAP, feels his CPAP works well  He does take a nap during the day on a regular basis  Outpatient Encounter Medications as of 05/22/2022  Medication Sig   ADVAIR DISKUS 100-50 MCG/DOSE AEPB Inhale 1 puff into the lungs in the morning and at bedtime.    allopurinol (ZYLOPRIM) 300 MG tablet Take 300 mg by mouth daily.   fluticasone (FLONASE) 50 MCG/ACT nasal spray Place 2 sprays into both nostrils daily.    montelukast (SINGULAIR) 10 MG tablet Take 10 mg by mouth at bedtime.    olmesartan (BENICAR) 20 MG tablet Take 40 mg by mouth daily.   omeprazole (PRILOSEC) 20 MG capsule Take 20 mg by mouth daily.    SPIRIVA RESPIMAT 2.5 MCG/ACT AERS SMARTSIG:2  Puff(s) Via Inhaler Daily   [DISCONTINUED] amoxicillin-clavulanate (AUGMENTIN) 875-125 MG tablet Take 1 tablet by mouth 2 (two) times daily.   [DISCONTINUED] finasteride (PROSCAR) 5 MG tablet Take 1 tablet (5 mg total) by mouth daily.   [DISCONTINUED] simvastatin (ZOCOR) 20 MG tablet Take 20 mg by mouth daily.    [DISCONTINUED] tamsulosin (FLOMAX) 0.4 MG CAPS capsule Take 1 capsule (0.4 mg total) by mouth daily after supper.   No facility-administered encounter medications on file as of 05/22/2022.    Allergies as of 05/22/2022 - Review Complete 05/22/2022  Allergen Reaction Noted   Bee venom Itching and Swelling 03/21/1991   Wasp venom protein Swelling 11/02/2021   Lisinopril Swelling     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   Sleep apnea    CPAP    Past Surgical History:  Procedure Laterality Date   CARPAL TUNNEL RELEASE Bilateral    CATARACT EXTRACTION W/PHACO Left 01/07/2019   Procedure: CATARACT EXTRACTION PHACO AND INTRAOCULAR LENS PLACEMENT (IOC) LEFT  00:35.0  16.3%  5.70;  Surgeon: Birder Robson, MD;  Location: [facility];  Service: Ophthalmology;  Laterality: Left;  sleep apnea   COLONOSCOPY WITH PROPOFOL N/A 09/29/2019  Procedure: COLONOSCOPY WITH PROPOFOL;  Surgeon: Daneil Dolin, MD;  [facility];  Service: Endoscopy;  Laterality: N/A;  0815   POLYPECTOMY  09/29/2019   Procedure: POLYPECTOMY;  Surgeon: Daneil Dolin, MD;  [facility];  Service: Endoscopy;;   TONSILLECTOMY     Age 9   WISDOM TOOTH EXTRACTION     x 4    Family History  Problem Relation Age of Onset   Dementia Mother    Thyroid disease Mother    Diabetes Mellitus II Mother    Cervical cancer Mother    Glaucoma Mother    Other Father        low blood pressure   Memory loss Father    Colon cancer Neg Hx     Social History    Socioeconomic History   Marital status: Married    Spouse name: Not on file   Number of children: Not on file   Years of education: Not on file   Highest education level: Not on file  Occupational History   Not on file  Tobacco Use   Smoking status: Former    Types: Cigarettes    Quit date: 03/20/1992    Years since quitting: 30.1   Smokeless tobacco: Never  Vaping Use   Vaping Use: Never used  Substance and Sexual Activity   Alcohol use: Yes    Comment: 1/2 pint per day   Drug use: No   Sexual activity: Not on file  Other Topics Concern   Not on file  Social History Narrative   Lives with wife   Caffeine use: Coffee (30oz per day)   Right handed   Social Determinants of Health   Financial Resource Strain: Not on file  Food Insecurity: Not on file  Transportation Needs: Not on file  Physical Activity: Not on file  Stress: Not on file  Social Connections: Not on file  Intimate Partner Violence: Not on file    Review of Systems  Respiratory:  Positive for apnea and shortness of breath.   Psychiatric/Behavioral:  Positive for sleep disturbance.     Vitals:   05/22/22 1029  BP: (!) 140/76  Pulse: 63  SpO2: 98%      Physical Exam Constitutional:      Appearance: He is obese.  HENT:     Head: Normocephalic.     Nose: No congestion.     Mouth/Throat:     Mouth: Mucous membranes are moist.  Eyes:     General: No scleral icterus.    Pupils: Pupils are equal, round, and reactive to light.  Cardiovascular:     Rate and Rhythm: Normal rate and regular rhythm.     Heart sounds: No murmur heard.    No friction rub.  Pulmonary:     Effort: No respiratory distress.     Breath sounds: No stridor. No wheezing or rhonchi.  Musculoskeletal:     Cervical back: No rigidity or tenderness.  Neurological:     Mental Status: He is alert.  Psychiatric:        Mood and Affect: Mood normal.       02/08/2021    9:00 AM  Results of the Epworth flowsheet  Sitting  and reading 1  Watching TV 0  Sitting, inactive in a public place (e.g. a theatre or a meeting) 0  As a passenger in a car for an hour without a break 0  Lying down to rest in the  afternoon when circumstances permit 3  Sitting and talking to someone 0  Sitting quietly after a lunch without alcohol 1  In a car, while stopped for a few minutes in traffic 0  Total score 5   Data Reviewed: No machine download available Previous PFT not on record  CPAP download shows excellent compliance average use of 7 hours 44 minutes On auto CPAP 5-20 with 95 percentile pressure of 15, AHI 1.2  Recent chest x-ray 12/20/2020 shows no acute infiltrate  Most recent PFT from 2023 shows no obstruction, no significant bronchodilator response, no restriction with normal diffusing capacity  Sleep study January 2024 reviewed showing AHI of 62  Assessment:   Obstructive lung disease -Appears stable -On Advair and Spiriva -Trying to get off Advair  Severe obstructive sleep apnea, appears well treated with CPAP at present -Received  new machine -He is bothered sometimes by the pressure fluctuations and would like to try fixed CPAP -Will is 95 percentile pressure being 15, will send a prescription to DME company to fix his pressure at 15 -Encouraged to give [place] a call to let us know whether this is well-tolerated  Class I obesity -Continue weight loss efforts  Plan/Recommendations: Continue Spiriva  Continues to wean off Advair  DME referral to fixed pressure at 15  Tentative follow-up in 6 months  Encouraged to give [place] a call with any other significant concerns  Sherrilyn Rist MD [facility] 05/22/2022, 10:47 AM  CC: Asencion Noble, MD

## 2022-05-24 DIAGNOSIS — S83271D Complex tear of lateral meniscus, current injury, right knee, subsequent encounter: Secondary | ICD-10-CM | POA: Diagnosis not present

## 2022-05-24 DIAGNOSIS — S83231D Complex tear of medial meniscus, current injury, right knee, subsequent encounter: Secondary | ICD-10-CM | POA: Diagnosis not present

## 2022-05-24 DIAGNOSIS — M84351D Stress fracture, right femur, subsequent encounter for fracture with routine healing: Secondary | ICD-10-CM | POA: Diagnosis not present

## 2022-06-07 DIAGNOSIS — M25561 Pain in right knee: Secondary | ICD-10-CM | POA: Diagnosis not present

## 2022-06-15 DIAGNOSIS — I10 Essential (primary) hypertension: Secondary | ICD-10-CM | POA: Diagnosis not present

## 2022-06-15 DIAGNOSIS — G4733 Obstructive sleep apnea (adult) (pediatric): Secondary | ICD-10-CM | POA: Diagnosis not present

## 2022-06-20 DIAGNOSIS — G4733 Obstructive sleep apnea (adult) (pediatric): Secondary | ICD-10-CM | POA: Diagnosis not present

## 2022-06-29 DIAGNOSIS — S83271D Complex tear of lateral meniscus, current injury, right knee, subsequent encounter: Secondary | ICD-10-CM | POA: Diagnosis not present

## 2022-06-29 DIAGNOSIS — S83231D Complex tear of medial meniscus, current injury, right knee, subsequent encounter: Secondary | ICD-10-CM | POA: Diagnosis not present

## 2022-06-29 DIAGNOSIS — M25561 Pain in right knee: Secondary | ICD-10-CM | POA: Diagnosis not present

## 2022-06-29 DIAGNOSIS — M84351D Stress fracture, right femur, subsequent encounter for fracture with routine healing: Secondary | ICD-10-CM | POA: Diagnosis not present

## 2022-07-20 DIAGNOSIS — G4733 Obstructive sleep apnea (adult) (pediatric): Secondary | ICD-10-CM | POA: Diagnosis not present

## 2022-07-28 DIAGNOSIS — N4289 Other specified disorders of prostate: Secondary | ICD-10-CM | POA: Diagnosis not present

## 2022-07-28 DIAGNOSIS — C61 Malignant neoplasm of prostate: Secondary | ICD-10-CM | POA: Diagnosis not present

## 2022-07-28 DIAGNOSIS — N400 Enlarged prostate without lower urinary tract symptoms: Secondary | ICD-10-CM | POA: Diagnosis not present

## 2022-08-02 DIAGNOSIS — Z125 Encounter for screening for malignant neoplasm of prostate: Secondary | ICD-10-CM | POA: Diagnosis not present

## 2022-08-02 DIAGNOSIS — C61 Malignant neoplasm of prostate: Secondary | ICD-10-CM | POA: Diagnosis not present

## 2022-08-10 ENCOUNTER — Encounter: Payer: Self-pay | Admitting: *Deleted

## 2022-08-20 DIAGNOSIS — G4733 Obstructive sleep apnea (adult) (pediatric): Secondary | ICD-10-CM | POA: Diagnosis not present

## 2022-08-22 ENCOUNTER — Telehealth: Payer: Self-pay | Admitting: *Deleted

## 2022-08-22 NOTE — Telephone Encounter (Signed)
  Procedure: Colonoscopy  Estimated body mass index is 33.75 kg/m as calculated from the following:   Height as of 05/22/22: 5\' 8"  (1.727 m).   Weight as of 05/22/22: 222 lb (100.7 kg).   Have you had a colonoscopy before?  09/29/19, Dr. Jena Gauss  Do you have family history of colon cancer?  no  Do you have a family history of polyps? yes  Previous colonoscopy with polyps removed? yes  Do you have a history colorectal cancer?   no  Are you diabetic?  no  Do you have a prosthetic or mechanical heart valve? no  Do you have a pacemaker/defibrillator?   no  Have you had endocarditis/atrial fibrillation?  no  Do you use supplemental oxygen/CPAP?  yes cpap  Have you had joint replacement within the last 12 months?  no  Do you tend to be constipated or have to use laxatives?  no   Do you have history of alcohol use? If yes, how much and how often.  Yes 3 drinks a day  Do you have history or are you using drugs? If yes, what do are you  using?  no  Have you ever had a stroke/heart attack?  no  Have you ever had a heart or other vascular stent placed,?no  Do you take weight loss medication? no  Do you take any blood-thinning medications such as: (Plavix, aspirin, Coumadin, Aggrenox, Brilinta, Xarelto, Eliquis, Pradaxa, Savaysa or Effient)? no  If yes we need the name, milligram, dosage and who is prescribing doctor:               Current Outpatient Medications  Medication Sig Dispense Refill   ADVAIR DISKUS 100-50 MCG/DOSE AEPB Inhale 1 puff into the lungs in the morning and at bedtime.      allopurinol (ZYLOPRIM) 300 MG tablet Take 300 mg by mouth daily.     fluticasone (FLONASE) 50 MCG/ACT nasal spray Place 2 sprays into both nostrils daily.      montelukast (SINGULAIR) 10 MG tablet Take 10 mg by mouth at bedtime.      olmesartan (BENICAR) 20 MG tablet Take 40 mg by mouth daily.     omeprazole (PRILOSEC) 20 MG capsule Take 20 mg by mouth daily.      SPIRIVA RESPIMAT 2.5 MCG/ACT  AERS SMARTSIG:2 Puff(s) Via Inhaler Daily     No current facility-administered medications for this visit.    Allergies  Allergen Reactions   Bee Venom Itching and Swelling   Wasp Venom Protein Swelling    Other reaction(s): angioedema   Lisinopril Swelling    lips

## 2022-08-28 DIAGNOSIS — Z961 Presence of intraocular lens: Secondary | ICD-10-CM | POA: Diagnosis not present

## 2022-08-28 DIAGNOSIS — Z0100 Encounter for examination of eyes and vision without abnormal findings: Secondary | ICD-10-CM | POA: Diagnosis not present

## 2022-08-28 DIAGNOSIS — Z9889 Other specified postprocedural states: Secondary | ICD-10-CM | POA: Diagnosis not present

## 2022-09-13 NOTE — Telephone Encounter (Signed)
Ok to schedule. ASA 3.  ? ?

## 2022-09-13 NOTE — Telephone Encounter (Signed)
Will call pt to schedule once we receive aug schedule for Dr. Rourk 

## 2022-09-19 DIAGNOSIS — G4733 Obstructive sleep apnea (adult) (pediatric): Secondary | ICD-10-CM | POA: Diagnosis not present

## 2022-10-02 DIAGNOSIS — M1000 Idiopathic gout, unspecified site: Secondary | ICD-10-CM | POA: Diagnosis not present

## 2022-10-02 DIAGNOSIS — R7303 Prediabetes: Secondary | ICD-10-CM | POA: Diagnosis not present

## 2022-10-02 DIAGNOSIS — Z79899 Other long term (current) drug therapy: Secondary | ICD-10-CM | POA: Diagnosis not present

## 2022-10-02 DIAGNOSIS — I10 Essential (primary) hypertension: Secondary | ICD-10-CM | POA: Diagnosis not present

## 2022-10-02 DIAGNOSIS — Z125 Encounter for screening for malignant neoplasm of prostate: Secondary | ICD-10-CM | POA: Diagnosis not present

## 2022-10-02 DIAGNOSIS — E876 Hypokalemia: Secondary | ICD-10-CM | POA: Diagnosis not present

## 2022-10-02 DIAGNOSIS — J4520 Mild intermittent asthma, uncomplicated: Secondary | ICD-10-CM | POA: Diagnosis not present

## 2022-10-02 DIAGNOSIS — K219 Gastro-esophageal reflux disease without esophagitis: Secondary | ICD-10-CM | POA: Diagnosis not present

## 2022-10-02 DIAGNOSIS — N400 Enlarged prostate without lower urinary tract symptoms: Secondary | ICD-10-CM | POA: Diagnosis not present

## 2022-10-04 ENCOUNTER — Encounter: Payer: Self-pay | Admitting: *Deleted

## 2022-10-04 MED ORDER — PEG 3350-KCL-NA BICARB-NACL 420 G PO SOLR: 4000.0000 mL | Freq: Once | ORAL | 0 refills | Status: AC

## 2022-10-04 NOTE — Telephone Encounter (Signed)
PA approved via cohere. Auth# [id number]  DOS: 11/08/2022 - 01/08/2023

## 2022-10-04 NOTE — Telephone Encounter (Signed)
Spoke with pt. He has been scheduled for 8/21. Aware will send instructions/pre-op appt. Rx for prep to be sent to pharmacy.

## 2022-10-04 NOTE — Addendum Note (Signed)
Addended by: Armstead Peaks on: 10/04/2022 09:51 AM   Modules accepted: Orders

## 2022-10-05 DIAGNOSIS — H903 Sensorineural hearing loss, bilateral: Secondary | ICD-10-CM | POA: Diagnosis not present

## 2022-10-10 DIAGNOSIS — G4733 Obstructive sleep apnea (adult) (pediatric): Secondary | ICD-10-CM | POA: Diagnosis not present

## 2022-10-10 DIAGNOSIS — E785 Hyperlipidemia, unspecified: Secondary | ICD-10-CM | POA: Diagnosis not present

## 2022-10-10 DIAGNOSIS — C61 Malignant neoplasm of prostate: Secondary | ICD-10-CM | POA: Diagnosis not present

## 2022-10-10 DIAGNOSIS — J45909 Unspecified asthma, uncomplicated: Secondary | ICD-10-CM | POA: Diagnosis not present

## 2022-10-10 DIAGNOSIS — R001 Bradycardia, unspecified: Secondary | ICD-10-CM | POA: Diagnosis not present

## 2022-10-10 DIAGNOSIS — I10 Essential (primary) hypertension: Secondary | ICD-10-CM | POA: Diagnosis not present

## 2022-10-10 DIAGNOSIS — Z0001 Encounter for general adult medical examination with abnormal findings: Secondary | ICD-10-CM | POA: Diagnosis not present

## 2022-10-12 DIAGNOSIS — H903 Sensorineural hearing loss, bilateral: Secondary | ICD-10-CM | POA: Diagnosis not present

## 2022-10-20 DIAGNOSIS — G4733 Obstructive sleep apnea (adult) (pediatric): Secondary | ICD-10-CM | POA: Diagnosis not present

## 2022-11-02 DIAGNOSIS — G4733 Obstructive sleep apnea (adult) (pediatric): Secondary | ICD-10-CM | POA: Diagnosis not present

## 2022-11-02 NOTE — Patient Instructions (Signed)
   Your procedure is scheduled on: 11/08/2022  Report to [facility] Main Entrance at   8:30  AM.  Call this number if you have problems the morning of surgery: [phone]   Remember:              Follow Directions on the letter you received from Your Physician's office regarding the Bowel Prep              No Smoking the day of Procedure :   Take these medicines the morning of surgery with A SIP OF WATER: Omeprazole   Use inhalers, Allopurinol,  and flonase if needed   Do not wear jewelry, make-up or nail polish.    Do not bring valuables to the hospital.  Contacts, dentures or bridgework may not be worn into surgery.  .   Patients discharged the day of surgery will not be allowed to drive home.     Colonoscopy, Adult, Care After This sheet gives you information about how to care for yourself after your procedure. Your health care provider may also give you more specific instructions. If you have problems or questions, contact your health care provider. What can I expect after the procedure? After the procedure, it is common to have: A small amount of blood in your stool for 24 hours after the procedure. Some gas. Mild abdominal cramping or bloating.  Follow these instructions at home: General instructions  For the first 24 hours after the procedure: Do not drive or use machinery. Do not sign important documents. Do not drink alcohol. Do your regular daily activities at a slower pace than normal. Eat soft, easy-to-digest foods. Rest often. Take over-the-counter or prescription medicines only as told by your health care provider. It is up to you to get the results of your procedure. Ask your health care provider, or the department performing the procedure, when your results will be ready. Relieving cramping and bloating Try walking around when you have cramps or feel bloated. Apply heat to your abdomen as told by your health care provider. Use a heat source that your  health care provider recommends, such as a moist heat pack or a heating pad. Place a towel between your skin and the heat source. Leave the heat on for 20-30 minutes. Remove the heat if your skin turns bright red. This is especially important if you are unable to feel pain, heat, or cold. You may have a greater risk of getting burned. Eating and drinking Drink enough fluid to keep your urine clear or pale yellow. Resume your normal diet as instructed by your health care provider. Avoid heavy or fried foods that are hard to digest. Avoid drinking alcohol for as long as instructed by your health care provider. Contact a health care provider if: You have blood in your stool 2-3 days after the procedure. Get help right away if: You have more than a small spotting of blood in your stool. You pass large blood clots in your stool. Your abdomen is swollen. You have nausea or vomiting. You have a fever. You have increasing abdominal pain that is not relieved with medicine. This information is not intended to replace advice given to you by your health care provider. Make sure you discuss any questions you have with your health care provider. Document Released: 10/19/2003 Document Revised: 11/29/2015 Document Reviewed: 05/18/2015 Elsevier Interactive Patient Education  [employer].

## 2022-11-03 ENCOUNTER — Encounter (HOSPITAL_COMMUNITY)
Admission: RE | Admit: 2022-11-03 | Discharge: 2022-11-03 | Disposition: A | Discharge status: Home / Self Care | Payer: Medicare PPO | Source: Ambulatory Visit | Attending: Internal Medicine | Admitting: Internal Medicine

## 2022-11-03 VITALS — BP 134/83 | HR 74 | Temp 97.8°F | Resp 18 | Ht 68.0 in | Wt 222.0 lb

## 2022-11-03 DIAGNOSIS — I491 Atrial premature depolarization: Secondary | ICD-10-CM | POA: Diagnosis not present

## 2022-11-03 DIAGNOSIS — I10 Essential (primary) hypertension: Secondary | ICD-10-CM | POA: Diagnosis not present

## 2022-11-03 DIAGNOSIS — Z01810 Encounter for preprocedural cardiovascular examination: Secondary | ICD-10-CM | POA: Diagnosis not present

## 2022-11-08 ENCOUNTER — Ambulatory Visit (HOSPITAL_COMMUNITY): Payer: Medicare PPO | Admitting: Certified Registered Nurse Anesthetist

## 2022-11-08 ENCOUNTER — Encounter (HOSPITAL_COMMUNITY)
Admission: RE | Disposition: A | Discharge status: Home / Self Care | Payer: Self-pay | Source: Home / Self Care | Attending: Internal Medicine

## 2022-11-08 ENCOUNTER — Ambulatory Visit (HOSPITAL_BASED_OUTPATIENT_CLINIC_OR_DEPARTMENT_OTHER): Payer: Medicare PPO | Admitting: Certified Registered Nurse Anesthetist

## 2022-11-08 ENCOUNTER — Ambulatory Visit (HOSPITAL_COMMUNITY)
Admission: RE | Admit: 2022-11-08 | Discharge: 2022-11-08 | Disposition: A | Discharge status: Home / Self Care | Payer: Medicare PPO | Attending: Internal Medicine | Admitting: Internal Medicine

## 2022-11-08 ENCOUNTER — Encounter (HOSPITAL_COMMUNITY): Payer: Self-pay | Admitting: Internal Medicine

## 2022-11-08 DIAGNOSIS — Z1211 Encounter for screening for malignant neoplasm of colon: Secondary | ICD-10-CM | POA: Diagnosis not present

## 2022-11-08 DIAGNOSIS — Z87891 Personal history of nicotine dependence: Secondary | ICD-10-CM | POA: Diagnosis not present

## 2022-11-08 DIAGNOSIS — J449 Chronic obstructive pulmonary disease, unspecified: Secondary | ICD-10-CM | POA: Diagnosis not present

## 2022-11-08 DIAGNOSIS — J4489 Other specified chronic obstructive pulmonary disease: Secondary | ICD-10-CM | POA: Diagnosis not present

## 2022-11-08 DIAGNOSIS — Z09 Encounter for follow-up examination after completed treatment for conditions other than malignant neoplasm: Secondary | ICD-10-CM

## 2022-11-08 DIAGNOSIS — K219 Gastro-esophageal reflux disease without esophagitis: Secondary | ICD-10-CM | POA: Diagnosis not present

## 2022-11-08 DIAGNOSIS — J45909 Unspecified asthma, uncomplicated: Secondary | ICD-10-CM | POA: Diagnosis not present

## 2022-11-08 DIAGNOSIS — I10 Essential (primary) hypertension: Secondary | ICD-10-CM | POA: Insufficient documentation

## 2022-11-08 DIAGNOSIS — Z86010 Personal history of colonic polyps: Secondary | ICD-10-CM

## 2022-11-08 DIAGNOSIS — K635 Polyp of colon: Secondary | ICD-10-CM | POA: Diagnosis not present

## 2022-11-08 DIAGNOSIS — K5730 Diverticulosis of large intestine without perforation or abscess without bleeding: Secondary | ICD-10-CM

## 2022-11-08 DIAGNOSIS — D122 Benign neoplasm of ascending colon: Secondary | ICD-10-CM

## 2022-11-08 DIAGNOSIS — G4730 Sleep apnea, unspecified: Secondary | ICD-10-CM | POA: Insufficient documentation

## 2022-11-08 HISTORY — PX: COLONOSCOPY WITH PROPOFOL: SHX5780

## 2022-11-08 HISTORY — PX: POLYPECTOMY: SHX149

## 2022-11-08 SURGERY — COLONOSCOPY WITH PROPOFOL
Anesthesia: General

## 2022-11-08 MED ORDER — LACTATED RINGERS IV SOLN: INTRAVENOUS | Status: DC

## 2022-11-08 MED ORDER — PROPOFOL 500 MG/50ML IV EMUL
INTRAVENOUS | Status: AC
  Filled 2022-11-08: qty 50

## 2022-11-08 MED ORDER — PROPOFOL 10 MG/ML IV BOLUS
INTRAVENOUS | Status: DC | PRN
Start: 2022-11-08 — End: 2022-11-08
  Administered 2022-11-08: 60 mg via INTRAVENOUS

## 2022-11-08 MED ORDER — PROPOFOL 500 MG/50ML IV EMUL
INTRAVENOUS | Status: DC | PRN
  Administered 2022-11-08: 150 ug/kg/min via INTRAVENOUS

## 2022-11-08 MED ORDER — LACTATED RINGERS IV SOLN: INTRAVENOUS | Status: DC | PRN

## 2022-11-08 NOTE — Transfer of Care (Signed)
Immediate Anesthesia Transfer of Care Note  Patient: Eric Lam.  Procedure(s) Performed: COLONOSCOPY WITH PROPOFOL POLYPECTOMY INTESTINAL  Patient Location: Short Stay  Anesthesia Type:General  Level of Consciousness: awake, alert , and oriented  Airway & Oxygen Therapy: Patient Spontanous Breathing  Post-op Assessment: Report given to RN, Post -op Vital signs reviewed and stable, Patient moving all extremities X 4, and Patient able to stick tongue midline  Post vital signs: Reviewed and stable  Last Vitals:  Vitals Value Taken Time  BP 106/58   Temp 97.8   Pulse 80   Resp 16   SpO2 98     Last Pain:  Vitals:   11/08/22 1047  TempSrc:   PainSc: 0-No pain         Complications: No notable events documented.

## 2022-11-08 NOTE — Op Note (Signed)
[facility] Patient Name: Eric Lam Procedure Date: 11/08/2022 10:31 AM MRN: [id number] Date of Birth: 1955-05-22 Attending MD: Gennette Pac , MD, [id number] CSN: [id number] Age: 67 Admit Type: Outpatient Procedure:                Colonoscopy Indications:              High risk colon cancer surveillance: Personal                            history of colonic polyps Providers:                Gennette Pac, MD, Crystal Page, Elinor Parkinson, Dyann Ruddle Referring MD:              Medicines:                Propofol per Anesthesia Complications:            No immediate complications. Estimated Blood Loss:     Estimated blood loss was minimal. Procedure:                Pre-Anesthesia Assessment:                           - Prior to the procedure, a History and Physical                            was performed, and patient medications and                            allergies were reviewed. The patient's tolerance of                            previous anesthesia was also reviewed. The risks                            and benefits of the procedure and the sedation                            options and risks were discussed with the patient.                            All questions were answered, and informed consent                            was obtained. Prior Anticoagulants: The patient has                            taken no anticoagulant or antiplatelet agents. ASA                            Grade Assessment: III - A patient with severe  systemic disease. After reviewing the risks and                            benefits, the patient was deemed in satisfactory                            condition to undergo the procedure.                           After obtaining informed consent, the colonoscope                            was passed under direct vision. Throughout the                            procedure,  the patient's blood pressure, pulse, and                            oxygen saturations were monitored continuously. The                            [id number]) scope was introduced through the                            anus and advanced to the the cecum, identified by                            appendiceal orifice and ileocecal valve. The                            colonoscopy was performed without difficulty. The                            colonoscopy was performed without difficulty. The                            colonoscopy was performed without difficulty. The                            entire colon was well visualized. Scope In: 10:51:08 AM Scope Out: 11:02:49 AM Scope Withdrawal Time: 0 hours 6 minutes 18 seconds  Total Procedure Duration: 0 hours 11 minutes 41 seconds  Findings:      The perianal and digital rectal examinations were normal.      A 5 mm polyp was found in the mid ascending colon. The polyp was       sessile. The polyp was removed with a cold snare. Resection and       retrieval were complete. Estimated blood loss was minimal.      Scattered medium-mouthed diverticula were found in the sigmoid colon and       descending colon.      The exam was otherwise without abnormality on direct and retroflexion       views. Impression:               - One 5 mm polyp in the mid  ascending colon,                            removed with a cold snare. Resected and retrieved.                           - Diverticulosis in the sigmoid colon and in the                            descending colon.                           - The examination was otherwise normal on direct                            and retroflexion views. Moderate Sedation:      Moderate (conscious) sedation was personally administered by an       anesthesia professional. The following parameters were monitored: oxygen       saturation, heart rate, blood pressure, respiratory rate, EKG, adequacy       of  pulmonary ventilation, and response to care. Recommendation:           - Patient has a contact number available for                            emergencies. The signs and symptoms of potential                            delayed complications were discussed with the                            patient. Return to normal activities tomorrow.                            Written discharge instructions were provided to the                            patient.                           - Advance diet as tolerated.                           - Continue present medications.                           - Repeat colonoscopy date to be determined after                            pending pathology results are reviewed for                            surveillance.                           - Return to GI office (date not yet determined). Procedure Code(s):        ---  Professional ---                           [phone], Colonoscopy, flexible; with removal of                            tumor(s), polyp(s), or other lesion(s) by snare                            technique Diagnosis Code(s):        --- Professional ---                           Z86.010, Personal history of colonic polyps                           D12.2, Benign neoplasm of ascending colon                           K57.30, Diverticulosis of large intestine without                            perforation or abscess without bleeding CPT copyright 2022 American [employer] Association. All rights reserved. The codes documented in this report are preliminary and upon coder review may  be revised to meet current compliance requirements. Gerrit Friends. Lareen Mullings, MD Gennette Pac, MD 11/08/2022 11:10:52 AM This report has been signed electronically. Number of Addenda: 0

## 2022-11-08 NOTE — Anesthesia Preprocedure Evaluation (Signed)
Anesthesia Evaluation  Patient identified by MRN, date of birth, ID band Patient awake    Reviewed: Allergy & Precautions, H&P , NPO status , Patient's Chart, lab work & pertinent test results, reviewed documented beta blocker date and time   Airway Mallampati: II  TM Distance: >3 FB Neck ROM: full    Dental no notable dental hx.    Pulmonary neg pulmonary ROS, asthma , sleep apnea , COPD, former smoker   Pulmonary exam normal breath sounds clear to auscultation       Cardiovascular Exercise Tolerance: Good hypertension, negative cardio ROS  Rhythm:regular Rate:Normal     Neuro/Psych negative neurological ROS  negative psych ROS   GI/Hepatic negative GI ROS, Neg liver ROS,GERD  ,,  Endo/Other  negative endocrine ROS    Renal/GU negative Renal ROS  negative genitourinary   Musculoskeletal   Abdominal   Peds  Hematology negative hematology ROS (+)   Anesthesia Other Findings   Reproductive/Obstetrics negative OB ROS                             Anesthesia Physical Anesthesia Plan  ASA: 2  Anesthesia Plan: General   Post-op Pain Management:    Induction:   PONV Risk Score and Plan: Propofol infusion  Airway Management Planned:   Additional Equipment:   Intra-op Plan:   Post-operative Plan:   Informed Consent: I have reviewed the patients History and Physical, chart, labs and discussed the procedure including the risks, benefits and alternatives for the proposed anesthesia with the patient or authorized representative who has indicated his/her understanding and acceptance.     Dental Advisory Given  Plan Discussed with: CRNA  Anesthesia Plan Comments:        Anesthesia Quick Evaluation

## 2022-11-08 NOTE — H&P (Signed)
@LOGO @   Primary Care Physician:  Carylon Perches, MD Primary Gastroenterologist:  Dr. Jena Gauss  Pre-Procedure History & Physical: HPI:  Eric Lam. is a 67 y.o. male here for  a surveillance colonoscopy.  History of multiple colonic adenomas removed over time.  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   Sleep apnea    CPAP    Past Surgical History:  Procedure Laterality Date   CARPAL TUNNEL RELEASE Bilateral    CATARACT EXTRACTION W/PHACO Left 01/07/2019   Procedure: CATARACT EXTRACTION PHACO AND INTRAOCULAR LENS PLACEMENT (IOC) LEFT  00:35.0  16.3%  5.70;  Surgeon: Galen Manila, MD;  Location: [facility];  Service: Ophthalmology;  Laterality: Left;  sleep apnea   COLONOSCOPY WITH PROPOFOL N/A 09/29/2019   Procedure: COLONOSCOPY WITH PROPOFOL;  Surgeon: Corbin Ade, MD;  Location: [facility];  Service: Endoscopy;  Laterality: N/A;  0815   POLYPECTOMY  09/29/2019   Procedure: POLYPECTOMY;  Surgeon: Corbin Ade, MD;  Location: [facility];  Service: Endoscopy;;   TONSILLECTOMY     Age 81   WISDOM TOOTH EXTRACTION     x 4    Prior to Admission medications   Medication Sig Start Date End Date Taking? Authorizing Provider  ADVAIR DISKUS 100-50 MCG/DOSE AEPB Inhale 1 puff into the lungs in the morning and at bedtime.  09/15/16  Yes [provider]  allopurinol (ZYLOPRIM) 300 MG tablet Take 300 mg by mouth daily.   Yes [provider]  fluticasone (FLONASE) 50 MCG/ACT nasal spray Place 2 sprays into both nostrils daily.  09/05/16  Yes [provider]  montelukast (SINGULAIR) 10 MG tablet Take 10 mg by mouth at bedtime.  09/18/16  Yes [provider]  olmesartan (BENICAR) 20 MG tablet Take 40 mg by mouth daily.   Yes [provider]  omeprazole (PRILOSEC) 20 MG capsule Take 20 mg by mouth daily.   09/11/16  Yes [provider]  simvastatin (ZOCOR) 20 MG tablet Take 20 mg by mouth at bedtime.   Yes [provider]  SPIRIVA RESPIMAT 2.5 MCG/ACT AERS SMARTSIG:2 Puff(s) Via Inhaler Daily 09/08/19  Yes [provider]    Allergies as of 10/04/2022 - Review Complete 08/22/2022  Allergen Reaction Noted   Bee venom Itching and Swelling 03/21/1991   Wasp venom protein Swelling 11/02/2021   Lisinopril Swelling     Family History  Problem Relation Age of Onset   Dementia Mother    Thyroid disease Mother    Diabetes Mellitus II Mother    Cervical cancer Mother    Glaucoma Mother    Other Father        low blood pressure   Memory loss Father    Colon cancer Neg Hx     Social History   Socioeconomic History   Marital status: Married    Spouse name: Not on file   Number of children: Not on file   Years of education: Not on file   Highest education level: Not on file  Occupational History   Not on file  Tobacco Use   Smoking status: Former    Current packs/day: 0.00    Types: Cigarettes    Quit date: 03/20/1992    Years since quitting: 30.6   Smokeless tobacco: Never  Vaping Use   Vaping status: Never  Used  Substance and Sexual Activity   Alcohol use: Yes    Comment: 1/2 pint per day   Drug use: No   Sexual activity: Not on file  Other Topics Concern   Not on file  Social History Narrative   Lives with wife   Caffeine use: Coffee (30oz per day)   Right handed   Social Determinants of Health   Financial Resource Strain: Not on file  Food Insecurity: Not on file  Transportation Needs: Not on file  Physical Activity: Not on file  Stress: Not on file  Social Connections: Not on file  Intimate Partner Violence: Not on file    Review of Systems: See HPI, otherwise negative ROS  Physical Exam: BP (!) 154/73   Pulse 65   Temp 98.7 F (37.1 C) (Oral)   Resp 19   Ht 5\' 8"  (1.727 m)   Wt 100.7 kg   SpO2 96%   BMI 33.76 kg/m  General:    Alert,  Well-developed, well-nourished, pleasant and cooperative in NAD Skin:  Intact without significant lesions or rashes. ENeck:  Supple; no masses or thyromegaly. No significant cervical adenopathy. Lungs:  Clear throughout to auscultation.   No wheezes, crackles, or rhonchi. No acute distress. Heart:  Regular rate and rhythm; no murmurs, clicks, rubs,  or gallops. Abdomen: Non-distended, normal bowel sounds.  Soft and nontender without appreciable mass or hepatosplenomegaly.  Impression/Plan:    67 year old gentleman with a history of multiple colonic adenomas removed over time here for surveillance colonoscopy.  The risks, benefits, limitations, alternatives and imponderables have been reviewed with the patient. Questions have been answered. All parties are agreeable.       Notice: This dictation was prepared with Dragon dictation along with smaller phrase technology. Any transcriptional errors that result from this process are unintentional and may not be corrected upon review.

## 2022-11-08 NOTE — Discharge Instructions (Signed)
  Colonoscopy Discharge Instructions  Read the instructions outlined below and refer to this sheet in the next few weeks. These discharge instructions provide you with general information on caring for yourself after you leave the hospital. Your doctor may also give you specific instructions. While your treatment has been planned according to the most current medical practices available, unavoidable complications occasionally occur. If you have any problems or questions after discharge, call Dr. Jena Gauss at [phone]. ACTIVITY You may resume your regular activity, but move at a slower pace for the next 24 hours.  Take frequent rest periods for the next 24 hours.  Walking will help get rid of the air and reduce the bloated feeling in your belly (abdomen).  No driving for 24 hours (because of the medicine (anesthesia) used during the test).   Do not sign any important legal documents or operate any machinery for 24 hours (because of the anesthesia used during the test).  NUTRITION Drink plenty of fluids.  You may resume your normal diet as instructed by your doctor.  Begin with a light meal and progress to your normal diet. Heavy or fried foods are harder to digest and may make you feel sick to your stomach (nauseated).  Avoid alcoholic beverages for 24 hours or as instructed.  MEDICATIONS You may resume your normal medications unless your doctor tells you otherwise.  WHAT YOU CAN EXPECT TODAY Some feelings of bloating in the abdomen.  Passage of more gas than usual.  Spotting of blood in your stool or on the toilet paper.  IF YOU HAD POLYPS REMOVED DURING THE COLONOSCOPY: No aspirin products for 7 days or as instructed.  No alcohol for 7 days or as instructed.  Eat a soft diet for the next 24 hours.  FINDING OUT THE RESULTS OF YOUR TEST Not all test results are available during your visit. If your test results are not back during the visit, make an appointment with your caregiver to find out the  results. Do not assume everything is normal if you have not heard from your caregiver or the medical facility. It is important for you to follow up on all of your test results.  SEEK IMMEDIATE MEDICAL ATTENTION IF: You have more than a spotting of blood in your stool.  Your belly is swollen (abdominal distention).  You are nauseated or vomiting.  You have a temperature over 101.  You have abdominal pain or discomfort that is severe or gets worse throughout the day.       1 small polyp found and removed today  Diverticulosis and colon polyp information provided   further recommendations to follow pending review of pathology report  At patient request, I called Irine Seal at [phone] findings and recommendations

## 2022-11-09 ENCOUNTER — Encounter: Payer: Self-pay | Admitting: Internal Medicine

## 2022-11-09 LAB — SURGICAL PATHOLOGY

## 2022-11-10 ENCOUNTER — Encounter (HOSPITAL_COMMUNITY): Payer: Self-pay | Admitting: Internal Medicine

## 2022-11-10 NOTE — Anesthesia Postprocedure Evaluation (Signed)
Anesthesia Post Note  Patient: Eric Lam.  Procedure(s) Performed: COLONOSCOPY WITH PROPOFOL POLYPECTOMY INTESTINAL  Patient location during evaluation: Phase II Anesthesia Type: General Level of consciousness: awake Pain management: pain level controlled Vital Signs Assessment: post-procedure vital signs reviewed and stable Respiratory status: spontaneous breathing and respiratory function stable Cardiovascular status: blood pressure returned to baseline and stable Postop Assessment: no headache and no apparent nausea or vomiting Anesthetic complications: no Comments: Late entry   No notable events documented.   Last Vitals:  Vitals:   11/08/22 0847 11/08/22 1106  BP: (!) 154/73 (!) 106/58  Pulse: 65 75  Resp: 19 16  Temp: 37.1 C 36.6 C  SpO2: 96% 99%    Last Pain:  Vitals:   11/09/22 1335  TempSrc:   PainSc: 0-No pain                 Windell Norfolk

## 2022-11-20 DIAGNOSIS — G4733 Obstructive sleep apnea (adult) (pediatric): Secondary | ICD-10-CM | POA: Diagnosis not present

## 2022-11-22 DIAGNOSIS — G4733 Obstructive sleep apnea (adult) (pediatric): Secondary | ICD-10-CM | POA: Diagnosis not present

## 2022-12-18 ENCOUNTER — Encounter: Payer: Self-pay | Admitting: Pulmonary Disease

## 2022-12-18 ENCOUNTER — Ambulatory Visit: Payer: Medicare PPO | Admitting: Pulmonary Disease

## 2022-12-18 VITALS — BP 112/70 | HR 69 | Ht 68.0 in | Wt 217.0 lb

## 2022-12-18 DIAGNOSIS — G4733 Obstructive sleep apnea (adult) (pediatric): Secondary | ICD-10-CM | POA: Diagnosis not present

## 2022-12-18 DIAGNOSIS — J449 Chronic obstructive pulmonary disease, unspecified: Secondary | ICD-10-CM | POA: Diagnosis not present

## 2022-12-18 NOTE — Progress Notes (Signed)
Eric Lam    [id number]    1955/04/13  Primary Care Physician:Fagan, Channing Mutters, MD  Referring Physician: Carylon Perches, MD [address] [place],  [place] [address]  Chief complaint:   History of obstructive sleep apnea  HPI:  Tolerating CPAP well  Breathing continues to be very steady as well Weaned off Advair, currently using Spiriva  Continues to use CPAP on a nightly basis Waking up feeling like he is at a good nights rest  Has no significant issues with his CPAP Sleeping about 7 hours 50 minutes, CPAP set at 15 with residual AHI of 0.6   Has had obstructive sleep apnea for over 30 years  Usually goes to bed about 11, wake up time about 6 AM Wakes up feeling rejuvenated  Weight has remained stable  Past history of smoking  Breathing is steady  He is on Spiriva Was able to stop Advair since his last visit and has been doing well  History of obstructive sleep apnea that was diagnosed many years ago Saw Dr. Shelle Iron in the office here He does recollect that his last pressure change was probably about pressure of 11  Denies any significant issues with his CPAP, feels his CPAP works well  He does take a nap during the day on a regular basis  Outpatient Encounter Medications as of 12/18/2022  Medication Sig   allopurinol (ZYLOPRIM) 300 MG tablet Take 300 mg by mouth daily.   fluticasone (FLONASE) 50 MCG/ACT nasal spray Place 2 sprays into both nostrils daily.    montelukast (SINGULAIR) 10 MG tablet Take 10 mg by mouth at bedtime.    olmesartan (BENICAR) 20 MG tablet Take 40 mg by mouth daily.   omeprazole (PRILOSEC) 20 MG capsule Take 20 mg by mouth daily.    simvastatin (ZOCOR) 20 MG tablet Take 20 mg by mouth at bedtime.   SPIRIVA RESPIMAT 2.5 MCG/ACT AERS SMARTSIG:2 Puff(s) Via Inhaler Daily   [DISCONTINUED] ADVAIR DISKUS 100-50 MCG/DOSE AEPB Inhale 1 puff into the lungs in the morning and at bedtime.    No facility-administered encounter  medications on file as of 12/18/2022.    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   Sleep apnea    CPAP    Past Surgical History:  Procedure Laterality Date   CARPAL TUNNEL RELEASE Bilateral    CATARACT EXTRACTION W/PHACO Left 01/07/2019   Procedure: CATARACT EXTRACTION PHACO AND INTRAOCULAR LENS PLACEMENT (IOC) LEFT  00:35.0  16.3%  5.70;  Surgeon: Galen Manila, MD;  Location: [facility];  Service: Ophthalmology;  Laterality: Left;  sleep apnea   COLONOSCOPY WITH PROPOFOL N/A 09/29/2019   Procedure: COLONOSCOPY WITH PROPOFOL;  Surgeon: Corbin Ade, MD;  Location: [facility];  Service: Endoscopy;  Laterality: N/A;  0815   COLONOSCOPY WITH PROPOFOL N/A 11/08/2022   Procedure: COLONOSCOPY WITH PROPOFOL;  Surgeon: Corbin Ade, MD;  [facility];  Service: Endoscopy;  Laterality: N/A;  1030AM, ASA 3   POLYPECTOMY  09/29/2019   Procedure: POLYPECTOMY;  Surgeon: Corbin Ade, MD;  [facility];  Service:  Endoscopy;;   POLYPECTOMY  11/08/2022   Procedure: POLYPECTOMY INTESTINAL;  Surgeon: Corbin Ade, MD;  [facility];  Service: Endoscopy;;   TONSILLECTOMY     Age 18   WISDOM TOOTH EXTRACTION     x 4    Family History  Problem Relation Age of Onset   Dementia Mother    Thyroid disease Mother    Diabetes Mellitus II Mother    Cervical cancer Mother    Glaucoma Mother    Other Father        low blood pressure   Memory loss Father    Colon cancer Neg Hx     Social History   Socioeconomic History   Marital status: Married    Spouse name: Not on file   Number of children: Not on file   Years of education:  Not on file   Highest education level: Not on file  Occupational History   Not on file  Tobacco Use   Smoking status: Former    Current packs/day: 0.00    Types: Cigarettes    Quit date: 03/20/1992    Years since quitting: 30.7   Smokeless tobacco: Never  Vaping Use   Vaping status: Never Used  Substance and Sexual Activity   Alcohol use: Yes    Comment: 1/2 pint per day   Drug use: No   Sexual activity: Not on file  Other Topics Concern   Not on file  Social History Narrative   Lives with wife   Caffeine use: Coffee (30oz per day)   Right handed   Social Determinants of Health   Financial Resource Strain: Not on file  Food Insecurity: Not on file  Transportation Needs: Not on file  Physical Activity: Not on file  Stress: Not on file  Social Connections: Not on file  Intimate Partner Violence: Not on file    Review of Systems  Respiratory:  Positive for apnea and shortness of breath.   Psychiatric/Behavioral:  Positive for sleep disturbance.     Vitals:   12/18/22 1052  BP: 112/70  Pulse: 69  SpO2: 96%      Physical Exam Constitutional:      Appearance: He is obese.  HENT:     Head: Normocephalic.     Nose: No congestion.     Mouth/Throat:     Mouth: Mucous membranes are moist.  Eyes:     General: No scleral icterus.    Pupils: Pupils are equal, round, and reactive to light.  Cardiovascular:     Rate and Rhythm: Normal rate and regular rhythm.     Heart sounds: No murmur heard.    No friction rub.  Pulmonary:     Effort: No respiratory distress.     Breath sounds: No stridor. No wheezing or rhonchi.  Musculoskeletal:     Cervical back: No rigidity or tenderness.  Neurological:     Mental Status: He is alert.  Psychiatric:        Mood and Affect: Mood normal.       02/08/2021    9:00 AM  Results of the Epworth flowsheet  Sitting and reading 1  Watching TV 0  Sitting, inactive in a public place (e.g. a theatre or a meeting) 0  As a  passenger in a car for an hour without a break 0  Lying down to rest in the afternoon when circumstances permit 3  Sitting and talking to someone 0  Sitting quietly after  a lunch without alcohol 1  In a car, while stopped for a few minutes in traffic 0  Total score 5   Data Reviewed: No machine download available Previous PFT not on record  CPAP download reviewed showing excellent compliance residual AHI of 0.6  Recent chest x-ray 12/20/2020 shows no acute infiltrate  Most recent PFT from 2023 shows no obstruction, no significant bronchodilator response, no restriction with normal diffusing capacity  Sleep study January 2024 reviewed showing AHI of 62  Assessment:   Obstructive lung disease -Continues to be stable -Tolerating CPAP well -Benefiting from CPAP use  Severe obstructive sleep apnea, appears well treated with CPAP at present -Has a new machine Machine seems to be working well  Class I obesity -Continue weight loss efforts  Obstructive lung disease -Tolerating Spiriva well  Plan/Recommendations: Continue Spiriva  Stay off Advair  Continue CPAP use  Follow-up a year from now  Call with significant concerns  Virl Diamond MD [facility] 12/18/2022, 10:59 AM  CC: Carylon Perches, MD

## 2022-12-18 NOTE — Patient Instructions (Signed)
I will see you about a year from now  Continue using your CPAP on a nightly basis  Continue using Spiriva on a daily basis  Call us with significant concerns  No changes need to be made at present

## 2022-12-20 DIAGNOSIS — G4733 Obstructive sleep apnea (adult) (pediatric): Secondary | ICD-10-CM | POA: Diagnosis not present

## 2023-01-20 DIAGNOSIS — G4733 Obstructive sleep apnea (adult) (pediatric): Secondary | ICD-10-CM | POA: Diagnosis not present

## 2023-02-19 DIAGNOSIS — G4733 Obstructive sleep apnea (adult) (pediatric): Secondary | ICD-10-CM | POA: Diagnosis not present

## 2023-02-22 DIAGNOSIS — M9904 Segmental and somatic dysfunction of sacral region: Secondary | ICD-10-CM | POA: Diagnosis not present

## 2023-02-22 DIAGNOSIS — M5451 Vertebrogenic low back pain: Secondary | ICD-10-CM | POA: Diagnosis not present

## 2023-02-22 DIAGNOSIS — M461 Sacroiliitis, not elsewhere classified: Secondary | ICD-10-CM | POA: Diagnosis not present

## 2023-02-22 DIAGNOSIS — M9903 Segmental and somatic dysfunction of lumbar region: Secondary | ICD-10-CM | POA: Diagnosis not present

## 2023-02-28 DIAGNOSIS — N400 Enlarged prostate without lower urinary tract symptoms: Secondary | ICD-10-CM | POA: Diagnosis not present

## 2023-03-22 DIAGNOSIS — M5451 Vertebrogenic low back pain: Secondary | ICD-10-CM | POA: Diagnosis not present

## 2023-03-22 DIAGNOSIS — G4733 Obstructive sleep apnea (adult) (pediatric): Secondary | ICD-10-CM | POA: Diagnosis not present

## 2023-03-22 DIAGNOSIS — M9904 Segmental and somatic dysfunction of sacral region: Secondary | ICD-10-CM | POA: Diagnosis not present

## 2023-03-22 DIAGNOSIS — M9903 Segmental and somatic dysfunction of lumbar region: Secondary | ICD-10-CM | POA: Diagnosis not present

## 2023-03-22 DIAGNOSIS — M461 Sacroiliitis, not elsewhere classified: Secondary | ICD-10-CM | POA: Diagnosis not present

## 2023-04-22 DIAGNOSIS — G4733 Obstructive sleep apnea (adult) (pediatric): Secondary | ICD-10-CM | POA: Diagnosis not present

## 2023-04-24 DIAGNOSIS — M9903 Segmental and somatic dysfunction of lumbar region: Secondary | ICD-10-CM | POA: Diagnosis not present

## 2023-04-24 DIAGNOSIS — M461 Sacroiliitis, not elsewhere classified: Secondary | ICD-10-CM | POA: Diagnosis not present

## 2023-04-24 DIAGNOSIS — M5451 Vertebrogenic low back pain: Secondary | ICD-10-CM | POA: Diagnosis not present

## 2023-04-24 DIAGNOSIS — M9904 Segmental and somatic dysfunction of sacral region: Secondary | ICD-10-CM | POA: Diagnosis not present

## 2023-05-22 DIAGNOSIS — M5451 Vertebrogenic low back pain: Secondary | ICD-10-CM | POA: Diagnosis not present

## 2023-05-22 DIAGNOSIS — M461 Sacroiliitis, not elsewhere classified: Secondary | ICD-10-CM | POA: Diagnosis not present

## 2023-05-22 DIAGNOSIS — M9903 Segmental and somatic dysfunction of lumbar region: Secondary | ICD-10-CM | POA: Diagnosis not present

## 2023-05-22 DIAGNOSIS — M9904 Segmental and somatic dysfunction of sacral region: Secondary | ICD-10-CM | POA: Diagnosis not present

## 2023-06-01 DIAGNOSIS — H903 Sensorineural hearing loss, bilateral: Secondary | ICD-10-CM | POA: Diagnosis not present

## 2023-06-19 DIAGNOSIS — M9903 Segmental and somatic dysfunction of lumbar region: Secondary | ICD-10-CM | POA: Diagnosis not present

## 2023-06-19 DIAGNOSIS — M5451 Vertebrogenic low back pain: Secondary | ICD-10-CM | POA: Diagnosis not present

## 2023-06-19 DIAGNOSIS — M9904 Segmental and somatic dysfunction of sacral region: Secondary | ICD-10-CM | POA: Diagnosis not present

## 2023-06-19 DIAGNOSIS — M461 Sacroiliitis, not elsewhere classified: Secondary | ICD-10-CM | POA: Diagnosis not present

## 2023-07-31 DIAGNOSIS — M461 Sacroiliitis, not elsewhere classified: Secondary | ICD-10-CM | POA: Diagnosis not present

## 2023-07-31 DIAGNOSIS — M5451 Vertebrogenic low back pain: Secondary | ICD-10-CM | POA: Diagnosis not present

## 2023-07-31 DIAGNOSIS — M9904 Segmental and somatic dysfunction of sacral region: Secondary | ICD-10-CM | POA: Diagnosis not present

## 2023-07-31 DIAGNOSIS — M9903 Segmental and somatic dysfunction of lumbar region: Secondary | ICD-10-CM | POA: Diagnosis not present

## 2023-08-21 DIAGNOSIS — G4733 Obstructive sleep apnea (adult) (pediatric): Secondary | ICD-10-CM | POA: Diagnosis not present

## 2023-08-29 DIAGNOSIS — Z961 Presence of intraocular lens: Secondary | ICD-10-CM | POA: Diagnosis not present

## 2023-08-29 DIAGNOSIS — Z0100 Encounter for examination of eyes and vision without abnormal findings: Secondary | ICD-10-CM | POA: Diagnosis not present

## 2023-09-06 DIAGNOSIS — N400 Enlarged prostate without lower urinary tract symptoms: Secondary | ICD-10-CM | POA: Diagnosis not present

## 2023-09-06 DIAGNOSIS — Z79899 Other long term (current) drug therapy: Secondary | ICD-10-CM | POA: Diagnosis not present

## 2023-09-06 DIAGNOSIS — C61 Malignant neoplasm of prostate: Secondary | ICD-10-CM | POA: Diagnosis not present

## 2023-09-11 DIAGNOSIS — M546 Pain in thoracic spine: Secondary | ICD-10-CM | POA: Diagnosis not present

## 2023-09-11 DIAGNOSIS — M9902 Segmental and somatic dysfunction of thoracic region: Secondary | ICD-10-CM | POA: Diagnosis not present

## 2023-09-11 DIAGNOSIS — M9903 Segmental and somatic dysfunction of lumbar region: Secondary | ICD-10-CM | POA: Diagnosis not present

## 2023-09-11 DIAGNOSIS — M461 Sacroiliitis, not elsewhere classified: Secondary | ICD-10-CM | POA: Diagnosis not present

## 2023-09-11 DIAGNOSIS — M5451 Vertebrogenic low back pain: Secondary | ICD-10-CM | POA: Diagnosis not present

## 2023-09-11 DIAGNOSIS — M9904 Segmental and somatic dysfunction of sacral region: Secondary | ICD-10-CM | POA: Diagnosis not present

## 2023-10-05 DIAGNOSIS — M1000 Idiopathic gout, unspecified site: Secondary | ICD-10-CM | POA: Diagnosis not present

## 2023-10-05 DIAGNOSIS — Z125 Encounter for screening for malignant neoplasm of prostate: Secondary | ICD-10-CM | POA: Diagnosis not present

## 2023-10-05 DIAGNOSIS — K219 Gastro-esophageal reflux disease without esophagitis: Secondary | ICD-10-CM | POA: Diagnosis not present

## 2023-10-05 DIAGNOSIS — N400 Enlarged prostate without lower urinary tract symptoms: Secondary | ICD-10-CM | POA: Diagnosis not present

## 2023-10-05 DIAGNOSIS — Z79899 Other long term (current) drug therapy: Secondary | ICD-10-CM | POA: Diagnosis not present

## 2023-10-05 DIAGNOSIS — I10 Essential (primary) hypertension: Secondary | ICD-10-CM | POA: Diagnosis not present

## 2023-10-05 DIAGNOSIS — E876 Hypokalemia: Secondary | ICD-10-CM | POA: Diagnosis not present

## 2023-10-05 DIAGNOSIS — R7303 Prediabetes: Secondary | ICD-10-CM | POA: Diagnosis not present

## 2023-10-05 DIAGNOSIS — J4520 Mild intermittent asthma, uncomplicated: Secondary | ICD-10-CM | POA: Diagnosis not present

## 2023-10-22 DIAGNOSIS — M461 Sacroiliitis, not elsewhere classified: Secondary | ICD-10-CM | POA: Diagnosis not present

## 2023-10-22 DIAGNOSIS — M5451 Vertebrogenic low back pain: Secondary | ICD-10-CM | POA: Diagnosis not present

## 2023-10-22 DIAGNOSIS — M9904 Segmental and somatic dysfunction of sacral region: Secondary | ICD-10-CM | POA: Diagnosis not present

## 2023-10-22 DIAGNOSIS — M9902 Segmental and somatic dysfunction of thoracic region: Secondary | ICD-10-CM | POA: Diagnosis not present

## 2023-10-22 DIAGNOSIS — M9903 Segmental and somatic dysfunction of lumbar region: Secondary | ICD-10-CM | POA: Diagnosis not present

## 2023-10-22 DIAGNOSIS — M546 Pain in thoracic spine: Secondary | ICD-10-CM | POA: Diagnosis not present

## 2023-11-29 DIAGNOSIS — M546 Pain in thoracic spine: Secondary | ICD-10-CM | POA: Diagnosis not present

## 2023-11-29 DIAGNOSIS — M9902 Segmental and somatic dysfunction of thoracic region: Secondary | ICD-10-CM | POA: Diagnosis not present

## 2023-11-29 DIAGNOSIS — M5451 Vertebrogenic low back pain: Secondary | ICD-10-CM | POA: Diagnosis not present

## 2023-11-29 DIAGNOSIS — M9903 Segmental and somatic dysfunction of lumbar region: Secondary | ICD-10-CM | POA: Diagnosis not present

## 2023-11-29 DIAGNOSIS — M9904 Segmental and somatic dysfunction of sacral region: Secondary | ICD-10-CM | POA: Diagnosis not present

## 2023-11-29 DIAGNOSIS — H903 Sensorineural hearing loss, bilateral: Secondary | ICD-10-CM | POA: Diagnosis not present

## 2023-11-29 DIAGNOSIS — M461 Sacroiliitis, not elsewhere classified: Secondary | ICD-10-CM | POA: Diagnosis not present

## 2023-12-06 DIAGNOSIS — R4189 Other symptoms and signs involving cognitive functions and awareness: Secondary | ICD-10-CM | POA: Diagnosis not present

## 2023-12-06 DIAGNOSIS — Z818 Family history of other mental and behavioral disorders: Secondary | ICD-10-CM | POA: Diagnosis not present

## 2023-12-06 DIAGNOSIS — R251 Tremor, unspecified: Secondary | ICD-10-CM | POA: Diagnosis not present

## 2023-12-06 DIAGNOSIS — E569 Vitamin deficiency, unspecified: Secondary | ICD-10-CM | POA: Diagnosis not present

## 2023-12-06 DIAGNOSIS — G4733 Obstructive sleep apnea (adult) (pediatric): Secondary | ICD-10-CM | POA: Diagnosis not present

## 2023-12-06 DIAGNOSIS — E559 Vitamin D deficiency, unspecified: Secondary | ICD-10-CM | POA: Diagnosis not present

## 2023-12-27 DIAGNOSIS — R4189 Other symptoms and signs involving cognitive functions and awareness: Secondary | ICD-10-CM | POA: Diagnosis not present

## 2023-12-27 DIAGNOSIS — Z818 Family history of other mental and behavioral disorders: Secondary | ICD-10-CM | POA: Diagnosis not present

## 2023-12-27 DIAGNOSIS — R413 Other amnesia: Secondary | ICD-10-CM | POA: Diagnosis not present

## 2024-01-10 DIAGNOSIS — M9903 Segmental and somatic dysfunction of lumbar region: Secondary | ICD-10-CM | POA: Diagnosis not present

## 2024-01-10 DIAGNOSIS — M546 Pain in thoracic spine: Secondary | ICD-10-CM | POA: Diagnosis not present

## 2024-01-10 DIAGNOSIS — M9904 Segmental and somatic dysfunction of sacral region: Secondary | ICD-10-CM | POA: Diagnosis not present

## 2024-01-10 DIAGNOSIS — M461 Sacroiliitis, not elsewhere classified: Secondary | ICD-10-CM | POA: Diagnosis not present

## 2024-01-10 DIAGNOSIS — M5451 Vertebrogenic low back pain: Secondary | ICD-10-CM | POA: Diagnosis not present

## 2024-01-10 DIAGNOSIS — M9902 Segmental and somatic dysfunction of thoracic region: Secondary | ICD-10-CM | POA: Diagnosis not present

## 2024-01-25 DIAGNOSIS — J4481 Bronchiolitis obliterans and bronchiolitis obliterans syndrome: Secondary | ICD-10-CM | POA: Diagnosis not present

## 2024-01-25 DIAGNOSIS — E559 Vitamin D deficiency, unspecified: Secondary | ICD-10-CM | POA: Diagnosis not present

## 2024-01-25 DIAGNOSIS — I10 Essential (primary) hypertension: Secondary | ICD-10-CM | POA: Diagnosis not present

## 2024-01-25 DIAGNOSIS — D519 Vitamin B12 deficiency anemia, unspecified: Secondary | ICD-10-CM | POA: Diagnosis not present
